# Patient Record
Sex: Female | Born: 1950 | Race: White | Hispanic: No | Marital: Single | Smoking: Former smoker
Health system: Southern US, Community
[De-identification: ages and names within clinical notes are randomized; demographics above are authoritative.]

## PROBLEM LIST (undated history)

## (undated) DIAGNOSIS — R519 Headache, unspecified: Secondary | ICD-10-CM

## (undated) DIAGNOSIS — I10 Essential (primary) hypertension: Secondary | ICD-10-CM

## (undated) DIAGNOSIS — I639 Cerebral infarction, unspecified: Secondary | ICD-10-CM

## (undated) DIAGNOSIS — R251 Tremor, unspecified: Secondary | ICD-10-CM

## (undated) DIAGNOSIS — H40059 Ocular hypertension, unspecified eye: Secondary | ICD-10-CM

## (undated) DIAGNOSIS — T7840XA Allergy, unspecified, initial encounter: Secondary | ICD-10-CM

## (undated) DIAGNOSIS — R001 Bradycardia, unspecified: Secondary | ICD-10-CM

## (undated) DIAGNOSIS — I499 Cardiac arrhythmia, unspecified: Secondary | ICD-10-CM

## (undated) DIAGNOSIS — J45909 Unspecified asthma, uncomplicated: Secondary | ICD-10-CM

## (undated) DIAGNOSIS — H269 Unspecified cataract: Secondary | ICD-10-CM

## (undated) DIAGNOSIS — R011 Cardiac murmur, unspecified: Secondary | ICD-10-CM

## (undated) DIAGNOSIS — F32A Depression, unspecified: Secondary | ICD-10-CM

## (undated) DIAGNOSIS — M1990 Unspecified osteoarthritis, unspecified site: Secondary | ICD-10-CM

## (undated) DIAGNOSIS — E785 Hyperlipidemia, unspecified: Secondary | ICD-10-CM

## (undated) DIAGNOSIS — K219 Gastro-esophageal reflux disease without esophagitis: Secondary | ICD-10-CM

## (undated) DIAGNOSIS — J189 Pneumonia, unspecified organism: Secondary | ICD-10-CM

## (undated) DIAGNOSIS — H409 Unspecified glaucoma: Secondary | ICD-10-CM

## (undated) DIAGNOSIS — F419 Anxiety disorder, unspecified: Secondary | ICD-10-CM

## (undated) DIAGNOSIS — I2510 Atherosclerotic heart disease of native coronary artery without angina pectoris: Secondary | ICD-10-CM

## (undated) HISTORY — DX: Hyperlipidemia, unspecified: E78.5

## (undated) HISTORY — PX: TONSILLECTOMY: SUR1361

## (undated) HISTORY — PX: OTHER SURGICAL HISTORY: SHX169

## (undated) HISTORY — DX: Allergy, unspecified, initial encounter: T78.40XA

## (undated) HISTORY — PX: APPENDECTOMY: SHX54

## (undated) HISTORY — PX: WRIST SURGERY: SHX841

## (undated) HISTORY — PX: FRACTURE SURGERY: SHX138

## (undated) HISTORY — PX: CATHETER REMOVAL: SHX911

## (undated) HISTORY — PX: HERNIA REPAIR: SHX51

## (undated) HISTORY — PX: COLONOSCOPY: SHX174

## (undated) HISTORY — PX: LUMBAR DISC SURGERY: SHX700

## (undated) HISTORY — DX: Unspecified glaucoma: H40.9

## (undated) HISTORY — DX: Bradycardia, unspecified: R00.1

## (undated) HISTORY — PX: TUBAL LIGATION: SHX77

## (undated) HISTORY — PX: EYE SURGERY: SHX253

## (undated) HISTORY — PX: ABDOMINAL HYSTERECTOMY: SHX81

## (undated) HISTORY — PX: SPINE SURGERY: SHX786

## (undated) HISTORY — PX: CHOLECYSTECTOMY: SHX55

## (undated) HISTORY — DX: Unspecified cataract: H26.9

## (undated) HISTORY — PX: UPPER GASTROINTESTINAL ENDOSCOPY: SHX188

---

## 1976-03-31 HISTORY — PX: WISDOM TOOTH EXTRACTION: SHX21

## 1999-04-01 HISTORY — PX: BLADDER SUSPENSION: SHX72

## 2013-09-16 DIAGNOSIS — J45909 Unspecified asthma, uncomplicated: Secondary | ICD-10-CM | POA: Insufficient documentation

## 2013-09-16 DIAGNOSIS — E7800 Pure hypercholesterolemia, unspecified: Secondary | ICD-10-CM | POA: Insufficient documentation

## 2013-09-16 DIAGNOSIS — F32A Depression, unspecified: Secondary | ICD-10-CM | POA: Insufficient documentation

## 2013-09-16 DIAGNOSIS — F419 Anxiety disorder, unspecified: Secondary | ICD-10-CM | POA: Insufficient documentation

## 2013-09-16 DIAGNOSIS — E785 Hyperlipidemia, unspecified: Secondary | ICD-10-CM | POA: Insufficient documentation

## 2013-09-16 DIAGNOSIS — I10 Essential (primary) hypertension: Secondary | ICD-10-CM | POA: Insufficient documentation

## 2014-05-12 DIAGNOSIS — R232 Flushing: Secondary | ICD-10-CM | POA: Insufficient documentation

## 2014-08-11 DIAGNOSIS — R251 Tremor, unspecified: Secondary | ICD-10-CM | POA: Insufficient documentation

## 2014-08-11 DIAGNOSIS — G250 Essential tremor: Secondary | ICD-10-CM | POA: Insufficient documentation

## 2014-12-21 DIAGNOSIS — K219 Gastro-esophageal reflux disease without esophagitis: Secondary | ICD-10-CM | POA: Insufficient documentation

## 2014-12-21 DIAGNOSIS — H409 Unspecified glaucoma: Secondary | ICD-10-CM | POA: Insufficient documentation

## 2015-01-08 DIAGNOSIS — Z23 Encounter for immunization: Secondary | ICD-10-CM | POA: Insufficient documentation

## 2015-01-08 DIAGNOSIS — M79641 Pain in right hand: Secondary | ICD-10-CM | POA: Insufficient documentation

## 2015-01-08 DIAGNOSIS — M79671 Pain in right foot: Secondary | ICD-10-CM | POA: Insufficient documentation

## 2015-03-13 DIAGNOSIS — M542 Cervicalgia: Secondary | ICD-10-CM | POA: Insufficient documentation

## 2015-03-13 DIAGNOSIS — R200 Anesthesia of skin: Secondary | ICD-10-CM | POA: Insufficient documentation

## 2015-05-02 DIAGNOSIS — S63056A Dislocation of other carpometacarpal joint of unspecified hand, initial encounter: Secondary | ICD-10-CM | POA: Insufficient documentation

## 2015-05-02 DIAGNOSIS — Z01818 Encounter for other preprocedural examination: Secondary | ICD-10-CM | POA: Insufficient documentation

## 2015-05-02 DIAGNOSIS — S63043A Subluxation of carpometacarpal joint of unspecified thumb, initial encounter: Secondary | ICD-10-CM | POA: Insufficient documentation

## 2015-06-01 DIAGNOSIS — Z0000 Encounter for general adult medical examination without abnormal findings: Secondary | ICD-10-CM | POA: Insufficient documentation

## 2015-06-15 DIAGNOSIS — M47812 Spondylosis without myelopathy or radiculopathy, cervical region: Secondary | ICD-10-CM | POA: Insufficient documentation

## 2015-06-15 DIAGNOSIS — M1990 Unspecified osteoarthritis, unspecified site: Secondary | ICD-10-CM | POA: Insufficient documentation

## 2016-02-04 DIAGNOSIS — M2061 Acquired deformities of toe(s), unspecified, right foot: Secondary | ICD-10-CM | POA: Insufficient documentation

## 2016-02-04 DIAGNOSIS — M2031 Hallux varus (acquired), right foot: Secondary | ICD-10-CM | POA: Insufficient documentation

## 2016-04-09 DIAGNOSIS — Z4789 Encounter for other orthopedic aftercare: Secondary | ICD-10-CM | POA: Insufficient documentation

## 2016-05-15 DIAGNOSIS — M7989 Other specified soft tissue disorders: Secondary | ICD-10-CM | POA: Insufficient documentation

## 2016-05-23 DIAGNOSIS — L03115 Cellulitis of right lower limb: Secondary | ICD-10-CM | POA: Insufficient documentation

## 2016-12-15 DIAGNOSIS — K219 Gastro-esophageal reflux disease without esophagitis: Secondary | ICD-10-CM | POA: Diagnosis not present

## 2016-12-15 DIAGNOSIS — M25511 Pain in right shoulder: Secondary | ICD-10-CM | POA: Diagnosis not present

## 2016-12-15 DIAGNOSIS — S4992XA Unspecified injury of left shoulder and upper arm, initial encounter: Secondary | ICD-10-CM | POA: Diagnosis not present

## 2016-12-15 DIAGNOSIS — F419 Anxiety disorder, unspecified: Secondary | ICD-10-CM | POA: Diagnosis not present

## 2016-12-15 DIAGNOSIS — M25512 Pain in left shoulder: Secondary | ICD-10-CM | POA: Diagnosis not present

## 2016-12-15 DIAGNOSIS — Z23 Encounter for immunization: Secondary | ICD-10-CM | POA: Diagnosis not present

## 2016-12-15 DIAGNOSIS — G4709 Other insomnia: Secondary | ICD-10-CM | POA: Insufficient documentation

## 2016-12-15 DIAGNOSIS — E785 Hyperlipidemia, unspecified: Secondary | ICD-10-CM | POA: Diagnosis not present

## 2016-12-15 DIAGNOSIS — I10 Essential (primary) hypertension: Secondary | ICD-10-CM | POA: Diagnosis not present

## 2017-01-12 DIAGNOSIS — E785 Hyperlipidemia, unspecified: Secondary | ICD-10-CM | POA: Diagnosis not present

## 2017-01-12 DIAGNOSIS — G4709 Other insomnia: Secondary | ICD-10-CM | POA: Diagnosis not present

## 2017-01-12 DIAGNOSIS — F419 Anxiety disorder, unspecified: Secondary | ICD-10-CM | POA: Diagnosis not present

## 2017-01-12 DIAGNOSIS — R232 Flushing: Secondary | ICD-10-CM | POA: Diagnosis not present

## 2017-02-10 DIAGNOSIS — H40003 Preglaucoma, unspecified, bilateral: Secondary | ICD-10-CM | POA: Diagnosis not present

## 2017-02-10 DIAGNOSIS — H40011 Open angle with borderline findings, low risk, right eye: Secondary | ICD-10-CM | POA: Diagnosis not present

## 2017-02-10 DIAGNOSIS — H02053 Trichiasis without entropian right eye, unspecified eyelid: Secondary | ICD-10-CM | POA: Diagnosis not present

## 2017-03-16 DIAGNOSIS — G4709 Other insomnia: Secondary | ICD-10-CM | POA: Diagnosis not present

## 2017-03-16 DIAGNOSIS — F419 Anxiety disorder, unspecified: Secondary | ICD-10-CM | POA: Diagnosis not present

## 2017-03-16 DIAGNOSIS — F329 Major depressive disorder, single episode, unspecified: Secondary | ICD-10-CM | POA: Diagnosis not present

## 2017-03-16 DIAGNOSIS — Z23 Encounter for immunization: Secondary | ICD-10-CM | POA: Diagnosis not present

## 2017-04-21 DIAGNOSIS — F419 Anxiety disorder, unspecified: Secondary | ICD-10-CM | POA: Diagnosis not present

## 2017-04-21 DIAGNOSIS — G4709 Other insomnia: Secondary | ICD-10-CM | POA: Diagnosis not present

## 2017-04-21 DIAGNOSIS — G250 Essential tremor: Secondary | ICD-10-CM | POA: Diagnosis not present

## 2017-04-21 DIAGNOSIS — J4520 Mild intermittent asthma, uncomplicated: Secondary | ICD-10-CM | POA: Diagnosis not present

## 2017-04-27 DIAGNOSIS — K432 Incisional hernia without obstruction or gangrene: Secondary | ICD-10-CM | POA: Diagnosis not present

## 2017-04-27 DIAGNOSIS — D171 Benign lipomatous neoplasm of skin and subcutaneous tissue of trunk: Secondary | ICD-10-CM | POA: Diagnosis not present

## 2017-05-04 DIAGNOSIS — K439 Ventral hernia without obstruction or gangrene: Secondary | ICD-10-CM | POA: Diagnosis not present

## 2017-05-04 DIAGNOSIS — K432 Incisional hernia without obstruction or gangrene: Secondary | ICD-10-CM | POA: Diagnosis not present

## 2017-05-04 DIAGNOSIS — I700 Atherosclerosis of aorta: Secondary | ICD-10-CM | POA: Diagnosis not present

## 2017-05-07 DIAGNOSIS — K432 Incisional hernia without obstruction or gangrene: Secondary | ICD-10-CM | POA: Diagnosis not present

## 2017-05-19 DIAGNOSIS — K432 Incisional hernia without obstruction or gangrene: Secondary | ICD-10-CM | POA: Diagnosis not present

## 2017-05-19 DIAGNOSIS — K430 Incisional hernia with obstruction, without gangrene: Secondary | ICD-10-CM | POA: Diagnosis not present

## 2017-05-23 DIAGNOSIS — S93402A Sprain of unspecified ligament of left ankle, initial encounter: Secondary | ICD-10-CM | POA: Diagnosis not present

## 2017-05-26 DIAGNOSIS — S86012A Strain of left Achilles tendon, initial encounter: Secondary | ICD-10-CM | POA: Diagnosis not present

## 2017-05-26 DIAGNOSIS — S93402A Sprain of unspecified ligament of left ankle, initial encounter: Secondary | ICD-10-CM | POA: Diagnosis not present

## 2017-05-26 DIAGNOSIS — M7732 Calcaneal spur, left foot: Secondary | ICD-10-CM | POA: Diagnosis not present

## 2017-06-03 DIAGNOSIS — H40011 Open angle with borderline findings, low risk, right eye: Secondary | ICD-10-CM | POA: Diagnosis not present

## 2017-06-03 DIAGNOSIS — H40003 Preglaucoma, unspecified, bilateral: Secondary | ICD-10-CM | POA: Diagnosis not present

## 2017-06-03 DIAGNOSIS — H02056 Trichiasis without entropian left eye, unspecified eyelid: Secondary | ICD-10-CM | POA: Diagnosis not present

## 2017-06-03 DIAGNOSIS — H02053 Trichiasis without entropian right eye, unspecified eyelid: Secondary | ICD-10-CM | POA: Diagnosis not present

## 2017-06-24 DIAGNOSIS — M19072 Primary osteoarthritis, left ankle and foot: Secondary | ICD-10-CM | POA: Diagnosis not present

## 2017-06-24 DIAGNOSIS — S93602D Unspecified sprain of left foot, subsequent encounter: Secondary | ICD-10-CM | POA: Diagnosis not present

## 2017-06-24 DIAGNOSIS — S93402A Sprain of unspecified ligament of left ankle, initial encounter: Secondary | ICD-10-CM | POA: Diagnosis not present

## 2017-07-21 DIAGNOSIS — Z0000 Encounter for general adult medical examination without abnormal findings: Secondary | ICD-10-CM | POA: Diagnosis not present

## 2017-07-21 DIAGNOSIS — G4709 Other insomnia: Secondary | ICD-10-CM | POA: Diagnosis not present

## 2017-07-21 DIAGNOSIS — I10 Essential (primary) hypertension: Secondary | ICD-10-CM | POA: Diagnosis not present

## 2017-07-21 DIAGNOSIS — F329 Major depressive disorder, single episode, unspecified: Secondary | ICD-10-CM | POA: Diagnosis not present

## 2017-07-21 DIAGNOSIS — Z87891 Personal history of nicotine dependence: Secondary | ICD-10-CM | POA: Diagnosis not present

## 2017-07-21 DIAGNOSIS — K760 Fatty (change of) liver, not elsewhere classified: Secondary | ICD-10-CM | POA: Insufficient documentation

## 2017-07-21 DIAGNOSIS — E785 Hyperlipidemia, unspecified: Secondary | ICD-10-CM | POA: Diagnosis not present

## 2017-08-31 DIAGNOSIS — S6391XA Sprain of unspecified part of right wrist and hand, initial encounter: Secondary | ICD-10-CM | POA: Diagnosis not present

## 2017-10-07 DIAGNOSIS — H40003 Preglaucoma, unspecified, bilateral: Secondary | ICD-10-CM | POA: Diagnosis not present

## 2017-10-07 DIAGNOSIS — H40011 Open angle with borderline findings, low risk, right eye: Secondary | ICD-10-CM | POA: Diagnosis not present

## 2017-10-07 DIAGNOSIS — H02056 Trichiasis without entropian left eye, unspecified eyelid: Secondary | ICD-10-CM | POA: Diagnosis not present

## 2017-10-07 DIAGNOSIS — H02053 Trichiasis without entropian right eye, unspecified eyelid: Secondary | ICD-10-CM | POA: Diagnosis not present

## 2018-02-01 DIAGNOSIS — F419 Anxiety disorder, unspecified: Secondary | ICD-10-CM | POA: Diagnosis not present

## 2018-02-01 DIAGNOSIS — G250 Essential tremor: Secondary | ICD-10-CM | POA: Diagnosis not present

## 2018-02-01 DIAGNOSIS — R413 Other amnesia: Secondary | ICD-10-CM | POA: Diagnosis not present

## 2018-02-01 DIAGNOSIS — I10 Essential (primary) hypertension: Secondary | ICD-10-CM | POA: Diagnosis not present

## 2018-02-01 DIAGNOSIS — E785 Hyperlipidemia, unspecified: Secondary | ICD-10-CM | POA: Diagnosis not present

## 2018-02-01 DIAGNOSIS — G4709 Other insomnia: Secondary | ICD-10-CM | POA: Diagnosis not present

## 2018-02-01 DIAGNOSIS — L821 Other seborrheic keratosis: Secondary | ICD-10-CM | POA: Diagnosis not present

## 2018-02-01 DIAGNOSIS — K760 Fatty (change of) liver, not elsewhere classified: Secondary | ICD-10-CM | POA: Diagnosis not present

## 2018-02-17 DIAGNOSIS — H02056 Trichiasis without entropian left eye, unspecified eyelid: Secondary | ICD-10-CM | POA: Diagnosis not present

## 2018-02-17 DIAGNOSIS — H40003 Preglaucoma, unspecified, bilateral: Secondary | ICD-10-CM | POA: Diagnosis not present

## 2018-02-17 DIAGNOSIS — H02053 Trichiasis without entropian right eye, unspecified eyelid: Secondary | ICD-10-CM | POA: Diagnosis not present

## 2018-02-17 DIAGNOSIS — H40011 Open angle with borderline findings, low risk, right eye: Secondary | ICD-10-CM | POA: Diagnosis not present

## 2018-05-04 DIAGNOSIS — R4189 Other symptoms and signs involving cognitive functions and awareness: Secondary | ICD-10-CM | POA: Diagnosis not present

## 2018-05-04 DIAGNOSIS — F341 Dysthymic disorder: Secondary | ICD-10-CM | POA: Diagnosis not present

## 2018-05-04 DIAGNOSIS — F413 Other mixed anxiety disorders: Secondary | ICD-10-CM | POA: Diagnosis not present

## 2018-06-15 DIAGNOSIS — G3184 Mild cognitive impairment, so stated: Secondary | ICD-10-CM | POA: Diagnosis not present

## 2018-06-15 DIAGNOSIS — F413 Other mixed anxiety disorders: Secondary | ICD-10-CM | POA: Diagnosis not present

## 2018-06-15 DIAGNOSIS — F341 Dysthymic disorder: Secondary | ICD-10-CM | POA: Diagnosis not present

## 2018-07-23 DIAGNOSIS — I10 Essential (primary) hypertension: Secondary | ICD-10-CM | POA: Diagnosis not present

## 2018-07-23 DIAGNOSIS — R252 Cramp and spasm: Secondary | ICD-10-CM | POA: Diagnosis not present

## 2018-07-23 DIAGNOSIS — Z0000 Encounter for general adult medical examination without abnormal findings: Secondary | ICD-10-CM | POA: Diagnosis not present

## 2018-07-23 DIAGNOSIS — F329 Major depressive disorder, single episode, unspecified: Secondary | ICD-10-CM | POA: Diagnosis not present

## 2018-07-23 DIAGNOSIS — Z87891 Personal history of nicotine dependence: Secondary | ICD-10-CM | POA: Diagnosis not present

## 2018-07-23 DIAGNOSIS — E7800 Pure hypercholesterolemia, unspecified: Secondary | ICD-10-CM | POA: Diagnosis not present

## 2018-07-23 DIAGNOSIS — Z79899 Other long term (current) drug therapy: Secondary | ICD-10-CM | POA: Diagnosis not present

## 2018-08-12 DIAGNOSIS — Z780 Asymptomatic menopausal state: Secondary | ICD-10-CM | POA: Diagnosis not present

## 2018-08-12 DIAGNOSIS — M8588 Other specified disorders of bone density and structure, other site: Secondary | ICD-10-CM | POA: Diagnosis not present

## 2018-08-12 DIAGNOSIS — M8580 Other specified disorders of bone density and structure, unspecified site: Secondary | ICD-10-CM | POA: Diagnosis not present

## 2018-08-12 DIAGNOSIS — Z13820 Encounter for screening for osteoporosis: Secondary | ICD-10-CM | POA: Diagnosis not present

## 2018-08-12 DIAGNOSIS — Z1231 Encounter for screening mammogram for malignant neoplasm of breast: Secondary | ICD-10-CM | POA: Diagnosis not present

## 2018-08-13 DIAGNOSIS — R252 Cramp and spasm: Secondary | ICD-10-CM | POA: Diagnosis not present

## 2018-08-13 DIAGNOSIS — R413 Other amnesia: Secondary | ICD-10-CM | POA: Diagnosis not present

## 2018-08-13 DIAGNOSIS — R251 Tremor, unspecified: Secondary | ICD-10-CM | POA: Diagnosis not present

## 2018-08-25 DIAGNOSIS — R251 Tremor, unspecified: Secondary | ICD-10-CM | POA: Diagnosis not present

## 2018-08-25 DIAGNOSIS — R413 Other amnesia: Secondary | ICD-10-CM | POA: Diagnosis not present

## 2018-09-10 DIAGNOSIS — I6381 Other cerebral infarction due to occlusion or stenosis of small artery: Secondary | ICD-10-CM | POA: Diagnosis not present

## 2018-09-10 DIAGNOSIS — R413 Other amnesia: Secondary | ICD-10-CM | POA: Insufficient documentation

## 2018-09-10 DIAGNOSIS — R251 Tremor, unspecified: Secondary | ICD-10-CM | POA: Diagnosis not present

## 2018-09-10 DIAGNOSIS — R252 Cramp and spasm: Secondary | ICD-10-CM | POA: Diagnosis not present

## 2019-01-07 DIAGNOSIS — G250 Essential tremor: Secondary | ICD-10-CM | POA: Diagnosis not present

## 2019-01-07 DIAGNOSIS — Z79899 Other long term (current) drug therapy: Secondary | ICD-10-CM | POA: Diagnosis not present

## 2019-01-07 DIAGNOSIS — I10 Essential (primary) hypertension: Secondary | ICD-10-CM | POA: Diagnosis not present

## 2019-01-07 DIAGNOSIS — E7800 Pure hypercholesterolemia, unspecified: Secondary | ICD-10-CM | POA: Diagnosis not present

## 2019-01-07 DIAGNOSIS — R7309 Other abnormal glucose: Secondary | ICD-10-CM | POA: Insufficient documentation

## 2019-01-07 DIAGNOSIS — Z87891 Personal history of nicotine dependence: Secondary | ICD-10-CM | POA: Diagnosis not present

## 2019-01-07 DIAGNOSIS — F419 Anxiety disorder, unspecified: Secondary | ICD-10-CM | POA: Diagnosis not present

## 2019-01-07 DIAGNOSIS — K219 Gastro-esophageal reflux disease without esophagitis: Secondary | ICD-10-CM | POA: Diagnosis not present

## 2019-01-07 DIAGNOSIS — Z23 Encounter for immunization: Secondary | ICD-10-CM | POA: Diagnosis not present

## 2019-01-07 DIAGNOSIS — F329 Major depressive disorder, single episode, unspecified: Secondary | ICD-10-CM | POA: Diagnosis not present

## 2019-01-13 DIAGNOSIS — I6381 Other cerebral infarction due to occlusion or stenosis of small artery: Secondary | ICD-10-CM | POA: Diagnosis not present

## 2019-01-13 DIAGNOSIS — R251 Tremor, unspecified: Secondary | ICD-10-CM | POA: Diagnosis not present

## 2019-01-13 DIAGNOSIS — R413 Other amnesia: Secondary | ICD-10-CM | POA: Diagnosis not present

## 2019-01-21 DIAGNOSIS — Z8371 Family history of colonic polyps: Secondary | ICD-10-CM | POA: Diagnosis not present

## 2019-01-21 DIAGNOSIS — K219 Gastro-esophageal reflux disease without esophagitis: Secondary | ICD-10-CM | POA: Diagnosis not present

## 2019-02-11 DIAGNOSIS — D128 Benign neoplasm of rectum: Secondary | ICD-10-CM | POA: Diagnosis not present

## 2019-02-11 DIAGNOSIS — K648 Other hemorrhoids: Secondary | ICD-10-CM | POA: Diagnosis not present

## 2019-02-11 DIAGNOSIS — D123 Benign neoplasm of transverse colon: Secondary | ICD-10-CM | POA: Diagnosis not present

## 2019-02-11 DIAGNOSIS — K219 Gastro-esophageal reflux disease without esophagitis: Secondary | ICD-10-CM | POA: Diagnosis not present

## 2019-02-11 DIAGNOSIS — K317 Polyp of stomach and duodenum: Secondary | ICD-10-CM | POA: Diagnosis not present

## 2019-02-11 DIAGNOSIS — D126 Benign neoplasm of colon, unspecified: Secondary | ICD-10-CM | POA: Diagnosis not present

## 2019-02-11 DIAGNOSIS — K635 Polyp of colon: Secondary | ICD-10-CM | POA: Diagnosis not present

## 2019-02-11 DIAGNOSIS — Z1211 Encounter for screening for malignant neoplasm of colon: Secondary | ICD-10-CM | POA: Diagnosis not present

## 2019-02-11 DIAGNOSIS — Z8371 Family history of colonic polyps: Secondary | ICD-10-CM | POA: Diagnosis not present

## 2019-02-11 DIAGNOSIS — Z86010 Personal history of colonic polyps: Secondary | ICD-10-CM | POA: Diagnosis not present

## 2019-02-11 DIAGNOSIS — K449 Diaphragmatic hernia without obstruction or gangrene: Secondary | ICD-10-CM | POA: Diagnosis not present

## 2019-02-11 DIAGNOSIS — K2960 Other gastritis without bleeding: Secondary | ICD-10-CM | POA: Diagnosis not present

## 2019-02-11 DIAGNOSIS — K3189 Other diseases of stomach and duodenum: Secondary | ICD-10-CM | POA: Diagnosis not present

## 2019-06-14 DIAGNOSIS — R252 Cramp and spasm: Secondary | ICD-10-CM | POA: Diagnosis not present

## 2019-06-14 DIAGNOSIS — R413 Other amnesia: Secondary | ICD-10-CM | POA: Diagnosis not present

## 2019-06-14 DIAGNOSIS — R42 Dizziness and giddiness: Secondary | ICD-10-CM | POA: Diagnosis not present

## 2019-06-14 DIAGNOSIS — Z8673 Personal history of transient ischemic attack (TIA), and cerebral infarction without residual deficits: Secondary | ICD-10-CM | POA: Diagnosis not present

## 2019-06-14 DIAGNOSIS — R519 Headache, unspecified: Secondary | ICD-10-CM | POA: Diagnosis not present

## 2019-06-14 DIAGNOSIS — R251 Tremor, unspecified: Secondary | ICD-10-CM | POA: Diagnosis not present

## 2019-08-12 DIAGNOSIS — H40011 Open angle with borderline findings, low risk, right eye: Secondary | ICD-10-CM | POA: Diagnosis not present

## 2019-08-12 DIAGNOSIS — H40003 Preglaucoma, unspecified, bilateral: Secondary | ICD-10-CM | POA: Diagnosis not present

## 2019-08-12 DIAGNOSIS — H02056 Trichiasis without entropian left eye, unspecified eyelid: Secondary | ICD-10-CM | POA: Diagnosis not present

## 2019-08-12 DIAGNOSIS — H02053 Trichiasis without entropian right eye, unspecified eyelid: Secondary | ICD-10-CM | POA: Diagnosis not present

## 2019-09-19 DIAGNOSIS — S61215A Laceration without foreign body of left ring finger without damage to nail, initial encounter: Secondary | ICD-10-CM | POA: Diagnosis not present

## 2019-09-26 DIAGNOSIS — H9313 Tinnitus, bilateral: Secondary | ICD-10-CM | POA: Insufficient documentation

## 2019-09-26 DIAGNOSIS — Z8616 Personal history of COVID-19: Secondary | ICD-10-CM | POA: Diagnosis not present

## 2019-09-26 DIAGNOSIS — H903 Sensorineural hearing loss, bilateral: Secondary | ICD-10-CM | POA: Diagnosis not present

## 2019-10-14 DIAGNOSIS — I10 Essential (primary) hypertension: Secondary | ICD-10-CM | POA: Diagnosis not present

## 2019-10-14 DIAGNOSIS — F419 Anxiety disorder, unspecified: Secondary | ICD-10-CM | POA: Diagnosis not present

## 2019-10-14 DIAGNOSIS — H6991 Unspecified Eustachian tube disorder, right ear: Secondary | ICD-10-CM | POA: Diagnosis not present

## 2019-10-14 DIAGNOSIS — J4520 Mild intermittent asthma, uncomplicated: Secondary | ICD-10-CM | POA: Diagnosis not present

## 2019-10-14 DIAGNOSIS — G250 Essential tremor: Secondary | ICD-10-CM | POA: Diagnosis not present

## 2019-10-14 DIAGNOSIS — R7309 Other abnormal glucose: Secondary | ICD-10-CM | POA: Diagnosis not present

## 2019-10-14 DIAGNOSIS — K219 Gastro-esophageal reflux disease without esophagitis: Secondary | ICD-10-CM | POA: Diagnosis not present

## 2019-10-14 DIAGNOSIS — Z87891 Personal history of nicotine dependence: Secondary | ICD-10-CM | POA: Diagnosis not present

## 2019-10-14 DIAGNOSIS — E7800 Pure hypercholesterolemia, unspecified: Secondary | ICD-10-CM | POA: Diagnosis not present

## 2019-10-14 DIAGNOSIS — G4709 Other insomnia: Secondary | ICD-10-CM | POA: Diagnosis not present

## 2019-10-14 DIAGNOSIS — F329 Major depressive disorder, single episode, unspecified: Secondary | ICD-10-CM | POA: Diagnosis not present

## 2019-10-17 DIAGNOSIS — R519 Headache, unspecified: Secondary | ICD-10-CM | POA: Diagnosis not present

## 2019-10-17 DIAGNOSIS — R208 Other disturbances of skin sensation: Secondary | ICD-10-CM | POA: Diagnosis not present

## 2019-10-17 DIAGNOSIS — R252 Cramp and spasm: Secondary | ICD-10-CM | POA: Diagnosis not present

## 2019-12-02 DIAGNOSIS — H02053 Trichiasis without entropian right eye, unspecified eyelid: Secondary | ICD-10-CM | POA: Diagnosis not present

## 2019-12-02 DIAGNOSIS — H40011 Open angle with borderline findings, low risk, right eye: Secondary | ICD-10-CM | POA: Diagnosis not present

## 2019-12-02 DIAGNOSIS — H40003 Preglaucoma, unspecified, bilateral: Secondary | ICD-10-CM | POA: Diagnosis not present

## 2019-12-02 DIAGNOSIS — H02056 Trichiasis without entropian left eye, unspecified eyelid: Secondary | ICD-10-CM | POA: Diagnosis not present

## 2019-12-13 DIAGNOSIS — R202 Paresthesia of skin: Secondary | ICD-10-CM | POA: Diagnosis not present

## 2019-12-13 DIAGNOSIS — R519 Headache, unspecified: Secondary | ICD-10-CM | POA: Diagnosis not present

## 2019-12-13 DIAGNOSIS — R252 Cramp and spasm: Secondary | ICD-10-CM | POA: Diagnosis not present

## 2020-02-01 DIAGNOSIS — H903 Sensorineural hearing loss, bilateral: Secondary | ICD-10-CM | POA: Diagnosis not present

## 2020-02-24 DIAGNOSIS — Z1231 Encounter for screening mammogram for malignant neoplasm of breast: Secondary | ICD-10-CM | POA: Diagnosis not present

## 2020-03-06 DIAGNOSIS — H73891 Other specified disorders of tympanic membrane, right ear: Secondary | ICD-10-CM | POA: Diagnosis not present

## 2020-03-06 DIAGNOSIS — Z974 Presence of external hearing-aid: Secondary | ICD-10-CM | POA: Diagnosis not present

## 2020-03-06 DIAGNOSIS — H9221 Otorrhagia, right ear: Secondary | ICD-10-CM | POA: Diagnosis not present

## 2020-03-06 DIAGNOSIS — H6121 Impacted cerumen, right ear: Secondary | ICD-10-CM | POA: Diagnosis not present

## 2020-04-04 DIAGNOSIS — H40011 Open angle with borderline findings, low risk, right eye: Secondary | ICD-10-CM | POA: Diagnosis not present

## 2020-04-04 DIAGNOSIS — H02053 Trichiasis without entropian right eye, unspecified eyelid: Secondary | ICD-10-CM | POA: Diagnosis not present

## 2020-04-04 DIAGNOSIS — H02056 Trichiasis without entropian left eye, unspecified eyelid: Secondary | ICD-10-CM | POA: Diagnosis not present

## 2020-04-23 DIAGNOSIS — R519 Headache, unspecified: Secondary | ICD-10-CM | POA: Diagnosis not present

## 2020-04-23 DIAGNOSIS — R252 Cramp and spasm: Secondary | ICD-10-CM | POA: Diagnosis not present

## 2020-04-25 DIAGNOSIS — Z79899 Other long term (current) drug therapy: Secondary | ICD-10-CM | POA: Diagnosis not present

## 2020-04-25 DIAGNOSIS — K5900 Constipation, unspecified: Secondary | ICD-10-CM | POA: Diagnosis not present

## 2020-04-25 DIAGNOSIS — Z0001 Encounter for general adult medical examination with abnormal findings: Secondary | ICD-10-CM | POA: Diagnosis not present

## 2020-04-25 DIAGNOSIS — Z87891 Personal history of nicotine dependence: Secondary | ICD-10-CM | POA: Diagnosis not present

## 2020-04-25 DIAGNOSIS — K219 Gastro-esophageal reflux disease without esophagitis: Secondary | ICD-10-CM | POA: Diagnosis not present

## 2020-04-25 DIAGNOSIS — E7800 Pure hypercholesterolemia, unspecified: Secondary | ICD-10-CM | POA: Diagnosis not present

## 2020-04-25 DIAGNOSIS — E785 Hyperlipidemia, unspecified: Secondary | ICD-10-CM | POA: Diagnosis not present

## 2020-04-25 DIAGNOSIS — I10 Essential (primary) hypertension: Secondary | ICD-10-CM | POA: Diagnosis not present

## 2020-04-25 DIAGNOSIS — Z0000 Encounter for general adult medical examination without abnormal findings: Secondary | ICD-10-CM | POA: Diagnosis not present

## 2020-04-25 DIAGNOSIS — Z974 Presence of external hearing-aid: Secondary | ICD-10-CM | POA: Diagnosis not present

## 2020-04-25 DIAGNOSIS — H9190 Unspecified hearing loss, unspecified ear: Secondary | ICD-10-CM | POA: Diagnosis not present

## 2020-04-25 DIAGNOSIS — F419 Anxiety disorder, unspecified: Secondary | ICD-10-CM | POA: Diagnosis not present

## 2020-05-22 DIAGNOSIS — B372 Candidiasis of skin and nail: Secondary | ICD-10-CM | POA: Diagnosis not present

## 2020-05-22 DIAGNOSIS — L219 Seborrheic dermatitis, unspecified: Secondary | ICD-10-CM | POA: Diagnosis not present

## 2020-08-15 DIAGNOSIS — H40011 Open angle with borderline findings, low risk, right eye: Secondary | ICD-10-CM | POA: Diagnosis not present

## 2020-08-15 DIAGNOSIS — H02056 Trichiasis without entropian left eye, unspecified eyelid: Secondary | ICD-10-CM | POA: Diagnosis not present

## 2020-08-15 DIAGNOSIS — H02053 Trichiasis without entropian right eye, unspecified eyelid: Secondary | ICD-10-CM | POA: Diagnosis not present

## 2020-08-15 DIAGNOSIS — H40003 Preglaucoma, unspecified, bilateral: Secondary | ICD-10-CM | POA: Diagnosis not present

## 2020-09-03 DIAGNOSIS — R252 Cramp and spasm: Secondary | ICD-10-CM | POA: Diagnosis not present

## 2020-09-03 DIAGNOSIS — R519 Headache, unspecified: Secondary | ICD-10-CM | POA: Diagnosis not present

## 2020-09-03 DIAGNOSIS — M542 Cervicalgia: Secondary | ICD-10-CM | POA: Diagnosis not present

## 2020-10-15 DIAGNOSIS — E7800 Pure hypercholesterolemia, unspecified: Secondary | ICD-10-CM | POA: Diagnosis not present

## 2020-10-15 DIAGNOSIS — F4322 Adjustment disorder with anxiety: Secondary | ICD-10-CM | POA: Diagnosis not present

## 2020-10-15 DIAGNOSIS — I10 Essential (primary) hypertension: Secondary | ICD-10-CM | POA: Diagnosis not present

## 2020-10-15 DIAGNOSIS — J4520 Mild intermittent asthma, uncomplicated: Secondary | ICD-10-CM | POA: Diagnosis not present

## 2020-10-15 DIAGNOSIS — G4709 Other insomnia: Secondary | ICD-10-CM | POA: Diagnosis not present

## 2020-10-15 DIAGNOSIS — G250 Essential tremor: Secondary | ICD-10-CM | POA: Diagnosis not present

## 2020-11-05 ENCOUNTER — Encounter (HOSPITAL_BASED_OUTPATIENT_CLINIC_OR_DEPARTMENT_OTHER): Payer: Self-pay

## 2020-11-05 ENCOUNTER — Emergency Department (HOSPITAL_BASED_OUTPATIENT_CLINIC_OR_DEPARTMENT_OTHER): Payer: PPO

## 2020-11-05 ENCOUNTER — Emergency Department (HOSPITAL_BASED_OUTPATIENT_CLINIC_OR_DEPARTMENT_OTHER)
Admission: EM | Admit: 2020-11-05 | Discharge: 2020-11-05 | Disposition: A | Discharge status: Home / Self Care | Payer: PPO | Attending: Emergency Medicine | Admitting: Emergency Medicine

## 2020-11-05 DIAGNOSIS — J45909 Unspecified asthma, uncomplicated: Secondary | ICD-10-CM | POA: Insufficient documentation

## 2020-11-05 DIAGNOSIS — R079 Chest pain, unspecified: Secondary | ICD-10-CM

## 2020-11-05 DIAGNOSIS — Z20822 Contact with and (suspected) exposure to covid-19: Secondary | ICD-10-CM | POA: Diagnosis not present

## 2020-11-05 DIAGNOSIS — Z20828 Contact with and (suspected) exposure to other viral communicable diseases: Secondary | ICD-10-CM | POA: Diagnosis not present

## 2020-11-05 DIAGNOSIS — Z87891 Personal history of nicotine dependence: Secondary | ICD-10-CM | POA: Diagnosis not present

## 2020-11-05 DIAGNOSIS — R0789 Other chest pain: Secondary | ICD-10-CM | POA: Diagnosis not present

## 2020-11-05 DIAGNOSIS — I10 Essential (primary) hypertension: Secondary | ICD-10-CM | POA: Insufficient documentation

## 2020-11-05 DIAGNOSIS — R519 Headache, unspecified: Secondary | ICD-10-CM | POA: Diagnosis not present

## 2020-11-05 DIAGNOSIS — R6884 Jaw pain: Secondary | ICD-10-CM | POA: Diagnosis not present

## 2020-11-05 DIAGNOSIS — U071 COVID-19: Secondary | ICD-10-CM | POA: Diagnosis not present

## 2020-11-05 HISTORY — DX: Cerebral infarction, unspecified: I63.9

## 2020-11-05 HISTORY — DX: Unspecified asthma, uncomplicated: J45.909

## 2020-11-05 HISTORY — DX: Ocular hypertension, unspecified eye: H40.059

## 2020-11-05 HISTORY — DX: Tremor, unspecified: R25.1

## 2020-11-05 HISTORY — DX: Anxiety disorder, unspecified: F41.9

## 2020-11-05 HISTORY — DX: Essential (primary) hypertension: I10

## 2020-11-05 HISTORY — DX: Depression, unspecified: F32.A

## 2020-11-05 LAB — CBC WITH DIFFERENTIAL/PLATELET
Abs Immature Granulocytes: 0.04 10*3/uL (ref 0.00–0.07)
Basophils Absolute: 0.1 10*3/uL (ref 0.0–0.1)
Basophils Relative: 1 %
Eosinophils Absolute: 0.1 10*3/uL (ref 0.0–0.5)
Eosinophils Relative: 1 %
HCT: 41.3 % (ref 36.0–46.0)
Hemoglobin: 13.3 g/dL (ref 12.0–15.0)
Immature Granulocytes: 1 %
Lymphocytes Relative: 18 %
Lymphs Abs: 1.3 10*3/uL (ref 0.7–4.0)
MCH: 28.5 pg (ref 26.0–34.0)
MCHC: 32.2 g/dL (ref 30.0–36.0)
MCV: 88.6 fL (ref 80.0–100.0)
Monocytes Absolute: 0.4 10*3/uL (ref 0.1–1.0)
Monocytes Relative: 6 %
Neutro Abs: 5.1 10*3/uL (ref 1.7–7.7)
Neutrophils Relative %: 73 %
Platelets: 240 10*3/uL (ref 150–400)
RBC: 4.66 MIL/uL (ref 3.87–5.11)
RDW: 13.2 % (ref 11.5–15.5)
WBC: 7 10*3/uL (ref 4.0–10.5)
nRBC: 0 % (ref 0.0–0.2)

## 2020-11-05 LAB — BASIC METABOLIC PANEL WITH GFR
Anion gap: 9 (ref 5–15)
BUN: 11 mg/dL (ref 8–23)
CO2: 29 mmol/L (ref 22–32)
Calcium: 8.9 mg/dL (ref 8.9–10.3)
Chloride: 101 mmol/L (ref 98–111)
Creatinine, Ser: 0.71 mg/dL (ref 0.44–1.00)
GFR, Estimated: >60 mL/min
Glucose, Bld: 104 mg/dL — ABNORMAL HIGH (ref 70–99)
Potassium: 4.3 mmol/L (ref 3.5–5.1)
Sodium: 139 mmol/L (ref 135–145)

## 2020-11-05 LAB — TROPONIN I (HIGH SENSITIVITY)
Troponin I (High Sensitivity): 2 ng/L
Troponin I (High Sensitivity): 3 ng/L

## 2020-11-05 MED ORDER — DIPHENHYDRAMINE HCL 50 MG/ML IJ SOLN
25.0000 mg | Freq: Once | INTRAMUSCULAR | Status: AC
  Administered 2020-11-05: 25 mg via INTRAVENOUS
  Filled 2020-11-05: qty 1

## 2020-11-05 MED ORDER — SODIUM CHLORIDE 0.9 % IV BOLUS
500.0000 mL | Freq: Once | INTRAVENOUS | Status: AC
  Administered 2020-11-05: 500 mL via INTRAVENOUS

## 2020-11-05 MED ORDER — METOCLOPRAMIDE HCL 5 MG/ML IJ SOLN
10.0000 mg | Freq: Once | INTRAMUSCULAR | Status: AC
  Administered 2020-11-05: 10 mg via INTRAVENOUS
  Filled 2020-11-05: qty 2

## 2020-11-05 NOTE — ED Provider Notes (Signed)
[facility] Provider Note   CSN: [id number] Arrival date & time: 11/05/20  1209     History Chief Complaint  Patient presents with   Chest Pain    Christy Jenkins is a 70 y.o. female.  Christy Jenkins is a 69 y.o. female with hx of HTN, stroke, tremors, anxiety and depression, who presents for evaluation of chest pain, jaw pain and headache. Pt reports he has been having jaw pain for about a week, it is present across her lower jaw, she went to her dentist last week for evaluation of this pain and they did not see any issues with her teeth.  She reports then Saturday she started having onset of chest pain, headache and neck pain.  Jaw pain has also continued.  She reports since onset of chest pain and headache they have waxed and waned in intensity but never completely resolved.  Patient reports headache was sudden in onset but took 30 minutes to an hour to reach maximal intensity.  She reports she has a history of headaches but this feels worse and different than usual.  She denies any associated visual changes, dizziness, facial droop, numbness, weakness or tingling.  Reports she has had some pain at the base of her neck but no neck stiffness.  She describes chest pain as a substernal discomfort, pain is nonradiating.  Pain is not pleuritic.  Patient denies any aggravating or alleviating factors for pain, not specifically worse with exertion.  Patient was at rest when symptoms initially began.  No associated shortness of breath or syncope.  No prior history of cardiac events, does not have a cardiologist she follows with.  No other aggravating or alleviating factors.  The history is provided by the patient.      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   Past Surgical History:  Procedure Laterality Date   ABDOMINAL HYSTERECTOMY     CHOLECYSTECTOMY     HERNIA REPAIR     TONSILLECTOMY       OB History   No obstetric history on file.     No family history on file.  Social History   Tobacco Use   Smoking status: Former    Types: Cigarettes  Substance Use Topics   Alcohol use: Not Currently    Home Medications Prior to Admission medications   Not on File    Allergies    Statins and Tape  Review of Systems   Review of Systems  Constitutional:  Negative for chills and fever.  HENT: Negative.    Respiratory:  Negative for cough and shortness of breath.   Cardiovascular:  Positive for chest pain.  Gastrointestinal:  Negative for abdominal pain, nausea and vomiting.  Genitourinary:  Negative for dysuria.  Musculoskeletal:  Positive for neck pain. Negative for arthralgias, myalgias and neck stiffness.  Skin:  Negative for color change and rash.  Neurological:  Positive for headaches. Negative for dizziness, syncope, facial asymmetry, speech difficulty, weakness, light-headedness and numbness.  All other systems reviewed and are negative.  Physical Exam Updated Vital Signs BP 135/64   Pulse 64   Temp (!) 97.5 F (36.4 C) (Oral)   Resp 19  Ht '5\' 3"'$  (1.6 m)   Wt 90.7 kg   SpO2 96%   BMI 35.43 kg/m   Physical Exam Vitals and nursing note reviewed.  Constitutional:      General: She is not in acute distress.    Appearance: Normal appearance. She is well-developed. She is not ill-appearing or diaphoretic.  HENT:     Head: Normocephalic and atraumatic.  Eyes:     General:        Right eye: No discharge.        Left eye: No discharge.     Pupils: Pupils are equal, round, and reactive to light.  Neck:     Comments: No rigidity or nuchal signs Cardiovascular:     Rate and Rhythm: Normal rate and regular rhythm.     Pulses: Normal pulses.          Radial pulses are 2+ on the right side and 2+ on the  left side.       Dorsalis pedis pulses are 2+ on the right side and 2+ on the left side.     Heart sounds: Normal heart sounds. No murmur heard.   No friction rub. No gallop.  Pulmonary:     Effort: Pulmonary effort is normal. No respiratory distress.     Breath sounds: Normal breath sounds. No wheezing or rales.     Comments: Respirations equal and unlabored, patient able to speak in full sentences, lungs clear to auscultation bilaterally  Chest:     Chest wall: No tenderness.  Abdominal:     General: Bowel sounds are normal. There is no distension.     Palpations: Abdomen is soft. There is no mass.     Tenderness: There is no abdominal tenderness. There is no guarding.     Comments: Abdomen soft, nondistended, nontender to palpation in all quadrants without guarding or peritoneal signs  Musculoskeletal:        General: No deformity.     Cervical back: Neck supple.     Right lower leg: No tenderness. No edema.     Left lower leg: No tenderness. No edema.  Skin:    General: Skin is warm and dry.     Capillary Refill: Capillary refill takes less than 2 seconds.  Neurological:     Mental Status: She is alert and oriented to person, place, and time.     Coordination: Coordination normal.     Comments: Speech is clear, able to follow commands CN III-XII intact Normal strength in upper and lower extremities bilaterally including dorsiflexion and plantar flexion, strong and equal grip strength Sensation normal to light and sharp touch Moves extremities without ataxia, coordination intact  Psychiatric:        Mood and Affect: Mood normal.        Behavior: Behavior normal.    ED Results / Procedures / Treatments   Labs (all labs ordered are listed, but only abnormal results are displayed) Labs Reviewed  BASIC METABOLIC PANEL - Abnormal; Notable for the following components:      Result Value   Glucose, Bld 104 (*)    All other components within normal limits  CBC WITH  DIFFERENTIAL/PLATELET  TROPONIN I (HIGH SENSITIVITY)  TROPONIN I (HIGH SENSITIVITY)    EKG EKG Interpretation  Date/Time:  Monday November 05 2020 12:17:50 EDT Ventricular Rate:  65 PR Interval:  166 QRS Duration: 95 QT Interval:  422 QTC Calculation: 439 R Axis:   35 Text Interpretation: Sinus rhythm Normal  ECG Confirmed by Blanchie Dessert [id number] on 11/05/2020 12:20:56 PM  Radiology DG Chest 2 View  Result Date: 11/05/2020 CLINICAL DATA:  Chest pain. EXAM: CHEST - 2 VIEW COMPARISON:  None. FINDINGS: Both lungs are clear. Heart and mediastinum are within normal limits. Negative for a pneumothorax. Atherosclerotic calcifications at the aortic arch. Degenerative changes in the upper lumbar spine region. IMPRESSION: No acute cardiopulmonary disease. Electronically Signed   By: Markus Daft M.D.   On: 11/05/2020 13:59   CT Head Wo Contrast  Result Date: 11/05/2020 CLINICAL DATA:  Headache, new or worsening.  Jaw pain. EXAM: CT HEAD WITHOUT CONTRAST TECHNIQUE: Contiguous axial images were obtained from the base of the skull through the vertex without intravenous contrast. COMPARISON:  Brain MRI 08/25/2018 FINDINGS: Brain: No evidence of acute infarction, hemorrhage, hydrocephalus, extra-axial collection or mass lesion/mass effect. Again noted is an empty sella which is similar to the previous MR findings. Vascular: No hyperdense vessel or unexpected calcification. Skull: Normal. Negative for fracture or focal lesion. Normal appearance of the TMJ joints. Sinuses/Orbits: No acute finding. Other: None. IMPRESSION: No acute intracranial abnormality. Electronically Signed   By: Markus Daft M.D.   On: 11/05/2020 14:05    Procedures Procedures   Medications Ordered in ED Medications  sodium chloride 0.9 % bolus 500 mL (0 mLs Intravenous Stopped 11/05/20 1447)  metoCLOPramide (REGLAN) injection 10 mg (10 mg Intravenous Given 11/05/20 1341)  diphenhydrAMINE (BENADRYL) injection 25 mg (25 mg Intravenous  Given 11/05/20 1341)    ED Course  I have reviewed the triage vital signs and the nursing notes.  Pertinent labs & imaging results that were available during my care of the patient were reviewed by me and considered in my medical decision making (see chart for details).    MDM Rules/Calculators/A&P                          Patient presents to the emergency department with chest pain and headache. Symptoms present persistently for the past 3 days, waxing and waning. Patient nontoxic appearing, in no apparent distress, vitals without significant abnormality. Fairly benign physical exam, no focal neurologic deficits. Equal pulses.  Patient's primary cardiologist: None  DDX: ACS, pulmonary embolism, dissection, pneumothorax, pneumonia, arrhythmia, severe anemia, MSK, GERD, anxiety, abdominal process.   Additional history obtained:  Additional history obtained from chart review & nursing note review.   EKG: NSR without concerning ischemic changes  Lab Tests:  I Ordered, reviewed, and interpreted labs, which included:  CBC: no leukocytosis, normal hgb BMP: No significant electrolyte derangements, normal renal function Troponin: neg x 2  Imaging Studies ordered:  I ordered imaging studies which included CXR & CT head, I independently reviewed, formal radiology impression shows:  CXR - no active cardiopulmonary disease CT Head- no acute intracranial abnormality  ED Course:   EKG without obvious acute ischemia, delta troponin negative, doubt ACS. Patient is low risk wells, symptoms very atypical, doubt pulmonary embolism. Pain is not a tearing sensation, symmetric pulses, no widening of mediastinum on CXR, doubt dissection. Cardiac monitor reviewed, no notable arrhythmias or tachycardia. Headache without red flag symptoms, no neuro deficits and HA resolved with treatment in ED. Patient has appeared hemodynamically stable throughout ER visit and appears safe for discharge with close  PCP/cardiology follow up. I discussed results, treatment plan, need for PCP follow-up, and return precautions with the patient. Provided opportunity for questions, patient confirmed understanding and is in agreement with plan.  Portions of this note were generated with [employer]. Dictation errors may occur despite best attempts at proofreading.      Final Clinical Impression(s) / ED Diagnoses Final diagnoses:  Central chest pain  Bad headache    Rx / DC Orders ED Discharge Orders          Ordered    Ambulatory referral to Cardiology        11/05/20 1609             Janet Berlin 11/08/20 1146    Blanchie Dessert, MD 11/12/20 [phone]

## 2020-11-05 NOTE — ED Notes (Signed)
Patient transported to CT 

## 2020-11-05 NOTE — Discharge Instructions (Addendum)
You were seen in the emergency department today for chest pain. Your work-up in the emergency department has been overall reassuring. Your labs have been fairly normal and or similar to previous blood work you have had done. Your EKG and the enzyme we use to check your heart did not show an acute heart attack at this time. Your chest x-ray was normal.   We would like you to follow up closely with your primary care provider and/or the cardiologist provided in your discharge instructions within 1-2 weeks. Return to the ER immediately should you experience any new or worsening symptoms including but not limited to return of pain, worsened pain, vomiting, shortness of breath, dizziness, lightheadedness, passing out, or any other concerns that you may have.

## 2020-11-05 NOTE — ED Triage Notes (Signed)
"  Headache, lower jaw pain, chest pressure. I saw the dentist and they said it was not my teeth. This morning I went to [facility] this morning and they told me to come here" per pt

## 2020-11-22 DIAGNOSIS — R202 Paresthesia of skin: Secondary | ICD-10-CM | POA: Diagnosis not present

## 2020-11-22 DIAGNOSIS — G250 Essential tremor: Secondary | ICD-10-CM | POA: Diagnosis not present

## 2020-11-22 DIAGNOSIS — M542 Cervicalgia: Secondary | ICD-10-CM | POA: Diagnosis not present

## 2020-11-22 DIAGNOSIS — R519 Headache, unspecified: Secondary | ICD-10-CM | POA: Diagnosis not present

## 2020-12-15 DIAGNOSIS — M5412 Radiculopathy, cervical region: Secondary | ICD-10-CM | POA: Diagnosis not present

## 2020-12-15 DIAGNOSIS — M542 Cervicalgia: Secondary | ICD-10-CM | POA: Diagnosis not present

## 2020-12-26 ENCOUNTER — Ambulatory Visit: Payer: PPO | Admitting: Cardiology

## 2021-01-07 DIAGNOSIS — M4802 Spinal stenosis, cervical region: Secondary | ICD-10-CM | POA: Diagnosis not present

## 2021-01-07 DIAGNOSIS — R202 Paresthesia of skin: Secondary | ICD-10-CM | POA: Diagnosis not present

## 2021-01-07 DIAGNOSIS — R252 Cramp and spasm: Secondary | ICD-10-CM | POA: Diagnosis not present

## 2021-01-17 ENCOUNTER — Ambulatory Visit: Payer: PPO | Admitting: Cardiology

## 2021-01-17 ENCOUNTER — Other Ambulatory Visit: Payer: Self-pay

## 2021-01-17 VITALS — BP 125/73 | HR 58 | Ht 63.0 in | Wt 200.2 lb

## 2021-01-17 DIAGNOSIS — I10 Essential (primary) hypertension: Secondary | ICD-10-CM

## 2021-01-17 DIAGNOSIS — E782 Mixed hyperlipidemia: Secondary | ICD-10-CM | POA: Diagnosis not present

## 2021-01-17 DIAGNOSIS — R079 Chest pain, unspecified: Secondary | ICD-10-CM

## 2021-01-17 DIAGNOSIS — Z8249 Family history of ischemic heart disease and other diseases of the circulatory system: Secondary | ICD-10-CM

## 2021-01-17 DIAGNOSIS — E785 Hyperlipidemia, unspecified: Secondary | ICD-10-CM | POA: Insufficient documentation

## 2021-01-17 DIAGNOSIS — R0602 Shortness of breath: Secondary | ICD-10-CM | POA: Diagnosis not present

## 2021-01-17 NOTE — Progress Notes (Signed)
Cardiology Office Note:    Date:  01/17/2021   ID:  Christy Jenkins, DOB [birth year], MRN [id number]  PCP:  Robyne Peers, MD  Cardiologist:  Berniece Salines, DO  Electrophysiologist:  None   Referring MD: Jacqlyn Larsen, PA-C   " I have had some chest pain "  History of Present Illness:    Christy Jenkins is a 70 y.o. female with a hx of hypertension, hyperlipidemia, obesity, family history of premature coronary artery disease in both parents with her mom significant MI at age 33, her dad's CABG was in his early 73s, gestational hypertension.  The patient tells me that back in August she had a significant episode where she had headaches she was experiencing some chest discomfort which felt like a heaviness.  With this she felt sensation of radiation to her jaw or jaw pain.  It was off and on.  With associated shortness of breath.  During that episode she was worked up in the ED EKG was normal troponin was negative.  She was asked to follow-up with cardiology.  She has not had any significant episodes since that time but is rather concerned given her risk factors.  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   ALPRAZolam (XANAX) 0.25 MG tablet Take 0.25 mg by mouth 3 (three) times daily as needed.   Calcium Citrate-Vitamin D 315-5 MG-MCG TABS Take 2 tablets by mouth at bedtime.   cetirizine (ZYRTEC) 10 MG tablet Take by mouth.   Cholecalciferol 25 MCG (1000 UT) tablet Take by mouth.   escitalopram (LEXAPRO) 20 MG tablet Take 20 mg by mouth daily.   ezetimibe  (ZETIA) 10 MG tablet Take by mouth.   fluconazole (DIFLUCAN) 150 MG tablet Take 150 mg by mouth daily as needed.   gabapentin (NEURONTIN) 800 MG tablet Take by mouth.   latanoprost (XALATAN) 0.005 % ophthalmic solution    losartan (COZAAR) 100 MG tablet Take 1 tablet by mouth daily.   Magnesium 250 MG TABS    methocarbamol (ROBAXIN) 500 MG tablet Take 500 mg by mouth as needed.   montelukast (SINGULAIR) 10 MG tablet Take by mouth.   Multiple Vitamin (MULTI-VITAMIN) tablet Take by mouth.   naproxen (NAPROSYN) 500 MG tablet Take by mouth as needed.   pantoprazole (PROTONIX) 40 MG tablet Take 1 tablet by mouth 2 (two) times daily.   primidone (MYSOLINE) 50 MG tablet TAKE 1 TO 2 TABLETS BY MOUTH NIGHTLY   tiZANidine (ZANAFLEX) 2 MG tablet Take 1-2 tabs twice daily as needed for pain, insomnia.   vitamin B-12 (CYANOCOBALAMIN) 500 MCG tablet    zonisamide (ZONEGRAN) 100 MG capsule at bedtime as needed.   [DISCONTINUED] losartan (COZAAR) 100 MG tablet Take 100 mg by mouth daily.   [DISCONTINUED] losartan-hydrochlorothiazide (HYZAAR) 100-12.5 MG tablet Take 1 tablet by mouth daily.     Allergies:   Statins and Tape   Social History   Socioeconomic History   Marital status:  Single    Spouse name: Not on file   Number of children: Not on file   Years of education: Not on file   Highest education level: Not on file  Occupational History   Not on file  Tobacco Use   Smoking status: Former    Types: Cigarettes   Smokeless tobacco: Not on file  Substance and Sexual Activity   Alcohol use: Not Currently   Drug use: Not on file   Sexual activity: Not on file  Other Topics Concern   Not on file  Social History Narrative   Not on file   Social Determinants of Health   Financial Resource Strain: Not on file  Food Insecurity: Not on file  Transportation Needs: Not on file  Physical Activity: Not on file  Stress: Not on file  Social Connections: Not on file     Family History: The  patient's family history is not on file.  ROS:   Review of Systems  Constitution: Negative for decreased appetite, fever and weight gain.  HENT: Negative for congestion, ear discharge, hoarse voice and sore throat.   Eyes: Negative for discharge, redness, vision loss in right eye and visual halos.  Cardiovascular: Recent chest pain, dyspnea on exertion, leg swelling, orthopnea and palpitations.  Respiratory: Negative for cough, hemoptysis, shortness of breath and snoring.   Endocrine: Negative for heat intolerance and polyphagia.  Hematologic/Lymphatic: Negative for bleeding problem. Does not bruise/bleed easily.  Skin: Negative for flushing, nail changes, rash and suspicious lesions.  Musculoskeletal: Negative for arthritis, joint pain, muscle cramps, myalgias, neck pain and stiffness.  Gastrointestinal: Negative for abdominal pain, bowel incontinence, diarrhea and excessive appetite.  Genitourinary: Negative for decreased libido, genital sores and incomplete emptying.  Neurological: Negative for brief paralysis, focal weakness, headaches and loss of balance.  Psychiatric/Behavioral: Negative for altered mental status, depression and suicidal ideas.  Allergic/Immunologic: Negative for HIV exposure and persistent infections.    EKGs/Labs/Other Studies Reviewed:    The following studies were reviewed today:   EKG:  The ekg ordered today demonstrates sinus bradycardia, heart rate 58 bpm.  Recent Labs: 11/05/2020: BUN 11; Creatinine, Ser 0.71; Hemoglobin 13.3; Platelets 240; Potassium 4.3; Sodium 139  Recent Lipid Panel No results found for: CHOL, TRIG, HDL, CHOLHDL, VLDL, LDLCALC, LDLDIRECT  Physical Exam:    VS:  BP 125/73   Pulse (!) 58   Ht 5\' 3"  (1.6 m)   Wt 200 lb 3.2 oz (90.8 kg)   SpO2 97%   BMI 35.46 kg/m     Wt Readings from Last 3 Encounters:  01/17/21 200 lb 3.2 oz (90.8 kg)  11/05/20 200 lb (90.7 kg)     GEN: Well nourished, well developed in no acute  distress HEENT: Normal NECK: No JVD; No carotid bruits LYMPHATICS: No lymphadenopathy CARDIAC: S1S2 noted,RRR, no murmurs, rubs, gallops RESPIRATORY:  Clear to auscultation without rales, wheezing or rhonchi  ABDOMEN: Soft, non-tender, non-distended, +bowel sounds, no guarding. EXTREMITIES: No edema, No cyanosis, no clubbing MUSCULOSKELETAL:  No deformity  SKIN: Warm and dry NEUROLOGIC:  Alert and oriented x 3, non-focal PSYCHIATRIC:  Normal affect, good insight  ASSESSMENT:    1. Chest pain of uncertain etiology   2. SOB (shortness of breath)   3. Primary hypertension   4. Mixed hyperlipidemia   5. Morbid obesity ([place])   6. Family history of premature CAD    PLAN:    The symptoms chest pain is concerning, this patient does have intermediate risk for coronary  artery disease and at this time I would like to pursue an ischemic evaluation in this patient.  Shared decision a coronary CTA at this time is appropriate.  I have discussed with the patient about the testing.  The patient has no IV contrast allergy and is agreeable to proceed with this test.  In addition, she has some shortness of breath a longstanding hypertension I would like to get an echocardiogram to rule out any diastolic dysfunction.  Blood pressure is acceptable, continue with current antihypertensive regimen.  Hyperlipidemia - continue with current statin medication.  The patient is in agreement with the above plan. The patient left the office in stable condition.  The patient will follow up in 4 months or sooner if needed.   Medication Adjustments/Labs and Tests Ordered: Current medicines are reviewed at length with the patient today.  Concerns regarding medicines are outlined above.  Orders Placed This Encounter  Procedures   CT CORONARY MORPH W/CTA COR W/SCORE W/CA W/CM &/OR WO/CM   Basic Metabolic Panel (BMET)   Magnesium   EKG 12-Lead   ECHOCARDIOGRAM COMPLETE   No orders of the defined types were  placed in this encounter.   Patient Instructions  Medication Instructions:  Your physician recommends that you continue on your current medications as directed. Please refer to the Current Medication list given to you today.  *If you need a refill on your cardiac medications before your next appointment, please call your pharmacy*   Lab Work: Your physician recommends that you return for lab work in:  3-7 days before CT scan: BMET, Mag If you have labs (blood work) drawn today and your tests are completely normal, you will receive your results only by: [place] (if you have MyChart) OR A paper copy in the mail If you have any lab test that is abnormal or we need to change your treatment, we will call you to review the results.   Testing/Procedures:   Your cardiac CT will be scheduled at one of the below locations:   [facility] [address] [place], [place] [address] [phone]   If scheduled at [facility], please arrive at the [facility] main entrance (entrance A) of [facility] 30 minutes prior to test start time. You can use the FREE valet parking offered at the main entrance (encouraged to control the heart rate for the test) Proceed to the [facility] Radiology Department (first floor) to check-in and test prep.  Please follow these instructions carefully (unless otherwise directed):  On the Night Before the Test: Be sure to Drink plenty of water. Do not consume any caffeinated/decaffeinated beverages or chocolate 12 hours prior to your test. Do not take any antihistamines 12 hours prior to your test.   On the Day of the Test: Drink plenty of water until 1 hour prior to the test. Do not eat any food 4 hours prior to the test. You may take your regular medications prior to the test.  FEMALES- please wear underwire-free bra if available, avoid dresses & tight clothing        After the Test: Drink plenty of water. After  receiving IV contrast, you may experience a mild flushed feeling. This is normal. On occasion, you may experience a mild rash up to 24 hours after the test. This is not dangerous. If this occurs, you can take Benadryl 25 mg and increase your fluid intake. If you experience trouble breathing, this can be serious. If it  is severe call 911 IMMEDIATELY. If it is mild, please call our office. If you take any of these medications: Glipizide/Metformin, Avandament, Glucavance, please do not take 48 hours after completing test unless otherwise instructed.  Please allow 2-4 weeks for scheduling of routine cardiac CTs. Some insurance companies require a pre-authorization which may delay scheduling of this test.   For non-scheduling related questions, please contact the cardiac imaging nurse navigator should you have any questions/concerns: Marchia Bond, Cardiac Imaging Nurse Navigator Gordy Clement, Cardiac Imaging Nurse Navigator [facility] Direct Office Dial: [phone]   For scheduling needs, including cancellations and rescheduling, please call [place], [phone].  Your physician has requested that you have an echocardiogram. Echocardiography is a painless test that uses sound waves to create images of your heart. It provides your doctor with information about the size and shape of your heart and how well your heart's chambers and valves are working. This procedure takes approximately one hour. There are no restrictions for this procedure.    Follow-Up: At [facility], you and your health needs are our priority.  As part of our continuing mission to provide you with exceptional heart care, we have created designated Provider Care Teams.  These Care Teams include your primary Cardiologist (physician) and Advanced Practice Providers (APPs -  Physician Assistants and Nurse Practitioners) who all work together to provide you with the care you need, when you need  it.  We recommend signing up for the patient portal called "MyChart".  Sign up information is provided on this After Visit Summary.  MyChart is used to connect with patients for Virtual Visits (Telemedicine).  Patients are able to view lab/test results, encounter notes, upcoming appointments, etc.  Non-urgent messages can be sent to your provider as well.   To learn more about what you can do with MyChart, go to [email or web address].    Your next appointment:   4 month(s)  The format for your next appointment:   In Person  Provider:   Berniece Salines, DO [id number], [place], [place] [address]    Other Instructions Echocardiogram An echocardiogram is a test that uses sound waves (ultrasound) to produce images of the heart. Images from an echocardiogram can provide important information about: Heart size and shape. The size and thickness and movement of your heart's walls. Heart muscle function and strength. Heart valve function or if you have stenosis. Stenosis is when the heart valves are too narrow. If blood is flowing backward through the heart valves (regurgitation). A tumor or infectious growth around the heart valves. Areas of heart muscle that are not working well because of poor blood flow or injury from a heart attack. Aneurysm detection. An aneurysm is a weak or damaged part of an artery wall. The wall bulges out from the normal force of blood pumping through the body. Tell a health care provider about: Any allergies you have. All medicines you are taking, including vitamins, herbs, eye drops, creams, and over-the-counter medicines. Any blood disorders you have. Any surgeries you have had. Any medical conditions you have. Whether you are pregnant or may be pregnant. What are the risks? Generally, this is a safe test. However, problems may occur, including an allergic reaction to dye (contrast) that may be used during the test. What happens before the test? No  specific preparation is needed. You may eat and drink normally. What happens during the test?  You will take off your clothes from the waist up and put  on a hospital gown. Electrodes or electrocardiogram (ECG)patches may be placed on your chest. The electrodes or patches are then connected to a device that monitors your heart rate and rhythm. You will lie down on a table for an ultrasound exam. A gel will be applied to your chest to help sound waves pass through your skin. A handheld device, called a transducer, will be pressed against your chest and moved over your heart. The transducer produces sound waves that travel to your heart and bounce back (or "echo" back) to the transducer. These sound waves will be captured in real-time and changed into images of your heart that can be viewed on a video monitor. The images will be recorded on a computer and reviewed by your health care provider. You may be asked to change positions or hold your breath for a short time. This makes it easier to get different views or better views of your heart. In some cases, you may receive contrast through an IV in one of your veins. This can improve the quality of the pictures from your heart. The procedure may vary among health care providers and hospitals. What can I expect after the test? You may return to your normal, everyday life, including diet, activities, and medicines, unless your health care provider tells you not to do that. Follow these instructions at home: It is up to you to get the results of your test. Ask your health care provider, or the department that is doing the test, when your results will be ready. Keep all follow-up visits. This is important. Summary An echocardiogram is a test that uses sound waves (ultrasound) to produce images of the heart. Images from an echocardiogram can provide important information about the size and shape of your heart, heart muscle function, heart valve function, and  other possible heart problems. You do not need to do anything to prepare before this test. You may eat and drink normally. After the echocardiogram is completed, you may return to your normal, everyday life, unless your health care provider tells you not to do that. This information is not intended to replace advice given to you by your health care provider. Make sure you discuss any questions you have with your health care provider. Document Revised: 11/08/2019 Document Reviewed: 11/08/2019 Elsevier Patient Education  2022 [employer].     Adopting a Healthy Lifestyle.  Know what a healthy weight is for you (roughly BMI <25) and aim to maintain this   Aim for 7+ servings of fruits and vegetables daily   65-80+ fluid ounces of water or unsweet tea for healthy kidneys   Limit to max 1 drink of alcohol per day; avoid smoking/tobacco   Limit animal fats in diet for cholesterol and heart health - choose grass fed whenever available   Avoid highly processed foods, and foods high in saturated/trans fats   Aim for low stress - take time to unwind and care for your mental health   Aim for 150 min of moderate intensity exercise weekly for heart health, and weights twice weekly for bone health   Aim for 7-9 hours of sleep daily   When it comes to diets, agreement about the perfect plan isnt easy to find, even among the experts. Experts at the [employer] developed an idea known as the Healthy Eating Plate. Just imagine a plate divided into logical, healthy portions.   The emphasis is on diet quality:   Load up on vegetables and  fruits - one-half of your plate: Aim for color and variety, and remember that potatoes dont count.   Go for whole grains - one-quarter of your plate: Whole wheat, barley, wheat berries, quinoa, oats, brown rice, and foods made with them. If you want pasta, go with whole wheat pasta.   Protein power - one-quarter of your plate: Fish, chicken,  beans, and nuts are all healthy, versatile protein sources. Limit red meat.   The diet, however, does go beyond the plate, offering a few other suggestions.   Use healthy plant oils, such as olive, canola, soy, corn, sunflower and peanut. Check the labels, and avoid partially hydrogenated oil, which have unhealthy trans fats.   If youre thirsty, drink water. Coffee and tea are good in moderation, but skip sugary drinks and limit milk and dairy products to one or two daily servings.   The type of carbohydrate in the diet is more important than the amount. Some sources of carbohydrates, such as vegetables, fruits, whole grains, and beans-are healthier than others.   Finally, stay active  Signed, Berniece Salines, DO  01/17/2021 5:06 PM    [facility]

## 2021-01-17 NOTE — Patient Instructions (Addendum)
Medication Instructions:  Your physician recommends that you continue on your current medications as directed. Please refer to the Current Medication list given to you today.  *If you need a refill on your cardiac medications before your next appointment, please call your pharmacy*   Lab Work: Your physician recommends that you return for lab work in:  3-7 days before CT scan: BMET, Mag If you have labs (blood work) drawn today and your tests are completely normal, you will receive your results only by: [place] (if you have MyChart) OR A paper copy in the mail If you have any lab test that is abnormal or we need to change your treatment, we will call you to review the results.   Testing/Procedures:   Your cardiac CT will be scheduled at one of the below locations:   [facility] [address] [place], [place] [address] [phone]   If scheduled at [facility], please arrive at the [facility] main entrance (entrance A) of [facility] 30 minutes prior to test start time. You can use the FREE valet parking offered at the main entrance (encouraged to control the heart rate for the test) Proceed to the [facility] Department (first floor) to check-in and test prep.  Please follow these instructions carefully (unless otherwise directed):  On the Night Before the Test: Be sure to Drink plenty of water. Do not consume any caffeinated/decaffeinated beverages or chocolate 12 hours prior to your test. Do not take any antihistamines 12 hours prior to your test.   On the Day of the Test: Drink plenty of water until 1 hour prior to the test. Do not eat any food 4 hours prior to the test. You may take your regular medications prior to the test.  FEMALES- please wear underwire-free bra if available, avoid dresses & tight clothing        After the Test: Drink plenty of water. After receiving IV contrast, you may experience a mild flushed  feeling. This is normal. On occasion, you may experience a mild rash up to 24 hours after the test. This is not dangerous. If this occurs, you can take Benadryl 25 mg and increase your fluid intake. If you experience trouble breathing, this can be serious. If it is severe call 911 IMMEDIATELY. If it is mild, please call our office. If you take any of these medications: Glipizide/Metformin, Avandament, Glucavance, please do not take 48 hours after completing test unless otherwise instructed.  Please allow 2-4 weeks for scheduling of routine cardiac CTs. Some insurance companies require a pre-authorization which may delay scheduling of this test.   For non-scheduling related questions, please contact the cardiac imaging nurse navigator should you have any questions/concerns: Marchia Bond, Cardiac Imaging Nurse Navigator Gordy Clement, Cardiac Imaging Nurse Navigator [facility] Direct Office Dial: [phone]   For scheduling needs, including cancellations and rescheduling, please call [place], [phone].  Your physician has requested that you have an echocardiogram. Echocardiography is a painless test that uses sound waves to create images of your heart. It provides your doctor with information about the size and shape of your heart and how well your heart's chambers and valves are working. This procedure takes approximately one hour. There are no restrictions for this procedure.    Follow-Up: At [facility], you and your health needs are our priority.  As part of our continuing mission to provide you with exceptional heart care, we have created designated  Provider Care Teams.  These Care Teams include your primary Cardiologist (physician) and Advanced Practice Providers (APPs -  Physician Assistants and Nurse Practitioners) who all work together to provide you with the care you need, when you need it.  We recommend signing up for the patient portal called  "MyChart".  Sign up information is provided on this After Visit Summary.  MyChart is used to connect with patients for Virtual Visits (Telemedicine).  Patients are able to view lab/test results, encounter notes, upcoming appointments, etc.  Non-urgent messages can be sent to your provider as well.   To learn more about what you can do with MyChart, go to [email or web address].    Your next appointment:   4 month(s)  The format for your next appointment:   In Person  Provider:   Berniece Salines, DO [address], [place], [place] [address]    Other Instructions Echocardiogram An echocardiogram is a test that uses sound waves (ultrasound) to produce images of the heart. Images from an echocardiogram can provide important information about: Heart size and shape. The size and thickness and movement of your heart's walls. Heart muscle function and strength. Heart valve function or if you have stenosis. Stenosis is when the heart valves are too narrow. If blood is flowing backward through the heart valves (regurgitation). A tumor or infectious growth around the heart valves. Areas of heart muscle that are not working well because of poor blood flow or injury from a heart attack. Aneurysm detection. An aneurysm is a weak or damaged part of an artery wall. The wall bulges out from the normal force of blood pumping through the body. Tell a health care provider about: Any allergies you have. All medicines you are taking, including vitamins, herbs, eye drops, creams, and over-the-counter medicines. Any blood disorders you have. Any surgeries you have had. Any medical conditions you have. Whether you are pregnant or may be pregnant. What are the risks? Generally, this is a safe test. However, problems may occur, including an allergic reaction to dye (contrast) that may be used during the test. What happens before the test? No specific preparation is needed. You may eat and drink  normally. What happens during the test?  You will take off your clothes from the waist up and put on a hospital gown. Electrodes or electrocardiogram (ECG)patches may be placed on your chest. The electrodes or patches are then connected to a device that monitors your heart rate and rhythm. You will lie down on a table for an ultrasound exam. A gel will be applied to your chest to help sound waves pass through your skin. A handheld device, called a transducer, will be pressed against your chest and moved over your heart. The transducer produces sound waves that travel to your heart and bounce back (or "echo" back) to the transducer. These sound waves will be captured in real-time and changed into images of your heart that can be viewed on a video monitor. The images will be recorded on a computer and reviewed by your health care provider. You may be asked to change positions or hold your breath for a short time. This makes it easier to get different views or better views of your heart. In some cases, you may receive contrast through an IV in one of your veins. This can improve the quality of the pictures from your heart. The procedure may vary among health care providers and hospitals. What can I expect after the test? You may return  to your normal, everyday life, including diet, activities, and medicines, unless your health care provider tells you not to do that. Follow these instructions at home: It is up to you to get the results of your test. Ask your health care provider, or the department that is doing the test, when your results will be ready. Keep all follow-up visits. This is important. Summary An echocardiogram is a test that uses sound waves (ultrasound) to produce images of the heart. Images from an echocardiogram can provide important information about the size and shape of your heart, heart muscle function, heart valve function, and other possible heart problems. You do not need to do  anything to prepare before this test. You may eat and drink normally. After the echocardiogram is completed, you may return to your normal, everyday life, unless your health care provider tells you not to do that. This information is not intended to replace advice given to you by your health care provider. Make sure you discuss any questions you have with your health care provider. Document Revised: 11/08/2019 Document Reviewed: 11/08/2019 Elsevier Patient Education  2022 [employer].

## 2021-01-21 DIAGNOSIS — R0602 Shortness of breath: Secondary | ICD-10-CM | POA: Diagnosis not present

## 2021-01-21 DIAGNOSIS — R079 Chest pain, unspecified: Secondary | ICD-10-CM | POA: Diagnosis not present

## 2021-01-22 LAB — BASIC METABOLIC PANEL
BUN/Creatinine Ratio: 14 (ref 12–28)
BUN: 12 mg/dL (ref 8–27)
CO2: 25 mmol/L (ref 20–29)
Calcium: 9.1 mg/dL (ref 8.7–10.3)
Chloride: 104 mmol/L (ref 96–106)
Creatinine, Ser: 0.83 mg/dL (ref 0.57–1.00)
Glucose: 79 mg/dL (ref 70–99)
Potassium: 4.2 mmol/L (ref 3.5–5.2)
Sodium: 140 mmol/L (ref 134–144)
eGFR: 76 mL/min/{1.73_m2} (ref >59)

## 2021-01-22 LAB — MAGNESIUM: Magnesium: 2.1 mg/dL (ref 1.6–2.3)

## 2021-01-25 ENCOUNTER — Telehealth (HOSPITAL_COMMUNITY): Payer: Self-pay | Admitting: *Deleted

## 2021-01-25 NOTE — Telephone Encounter (Signed)
Reaching out to patient to offer assistance regarding upcoming cardiac imaging study; pt verbalizes understanding of appt date/time, parking situation and where to check in, pre-test NPO status, and verified current allergies; name and call back number provided for further questions should they arise  Lorrie Strauch RN Navigator Cardiac Imaging [facility] [phone] office [phone] cell  

## 2021-01-28 ENCOUNTER — Other Ambulatory Visit: Payer: Self-pay

## 2021-01-28 ENCOUNTER — Ambulatory Visit (HOSPITAL_COMMUNITY)
Admission: RE | Admit: 2021-01-28 | Discharge: 2021-01-28 | Disposition: A | Discharge status: Home / Self Care | Payer: PPO | Source: Ambulatory Visit | Attending: Cardiology | Admitting: Cardiology

## 2021-01-28 ENCOUNTER — Other Ambulatory Visit (HOSPITAL_COMMUNITY): Payer: Self-pay | Admitting: Emergency Medicine

## 2021-01-28 DIAGNOSIS — R931 Abnormal findings on diagnostic imaging of heart and coronary circulation: Secondary | ICD-10-CM

## 2021-01-28 DIAGNOSIS — R079 Chest pain, unspecified: Secondary | ICD-10-CM | POA: Insufficient documentation

## 2021-01-28 DIAGNOSIS — I700 Atherosclerosis of aorta: Secondary | ICD-10-CM | POA: Insufficient documentation

## 2021-01-28 DIAGNOSIS — K449 Diaphragmatic hernia without obstruction or gangrene: Secondary | ICD-10-CM | POA: Diagnosis not present

## 2021-01-28 DIAGNOSIS — I2510 Atherosclerotic heart disease of native coronary artery without angina pectoris: Secondary | ICD-10-CM | POA: Diagnosis not present

## 2021-01-28 MED ORDER — NITROGLYCERIN 0.4 MG SL SUBL
SUBLINGUAL_TABLET | SUBLINGUAL | Status: AC
  Filled 2021-01-28: qty 2

## 2021-01-28 MED ORDER — NITROGLYCERIN 0.4 MG SL SUBL
0.8000 mg | SUBLINGUAL_TABLET | Freq: Once | SUBLINGUAL | Status: AC
  Administered 2021-01-28: 0.8 mg via SUBLINGUAL

## 2021-01-28 MED ORDER — IOHEXOL 350 MG/ML SOLN
95.0000 mL | Freq: Once | INTRAVENOUS | Status: AC | PRN
  Administered 2021-01-28: 95 mL via INTRAVENOUS

## 2021-01-29 DIAGNOSIS — I2510 Atherosclerotic heart disease of native coronary artery without angina pectoris: Secondary | ICD-10-CM | POA: Diagnosis not present

## 2021-01-31 ENCOUNTER — Other Ambulatory Visit: Payer: Self-pay

## 2021-01-31 DIAGNOSIS — E782 Mixed hyperlipidemia: Secondary | ICD-10-CM

## 2021-01-31 DIAGNOSIS — M5412 Radiculopathy, cervical region: Secondary | ICD-10-CM | POA: Diagnosis not present

## 2021-01-31 DIAGNOSIS — Z789 Other specified health status: Secondary | ICD-10-CM

## 2021-01-31 MED ORDER — ASPIRIN EC 81 MG PO TBEC: 81.0000 mg | DELAYED_RELEASE_TABLET | Freq: Every day | ORAL | 3 refills | Status: AC

## 2021-01-31 NOTE — Progress Notes (Signed)
Referral made for Lipid clinic.

## 2021-02-05 ENCOUNTER — Other Ambulatory Visit: Payer: Self-pay

## 2021-02-05 ENCOUNTER — Ambulatory Visit (HOSPITAL_COMMUNITY): Payer: PPO | Attending: Cardiology

## 2021-02-05 DIAGNOSIS — R0602 Shortness of breath: Secondary | ICD-10-CM | POA: Insufficient documentation

## 2021-02-05 LAB — ECHOCARDIOGRAM COMPLETE
Area-P 1/2: 4.39 cm2
S' Lateral: 3.1 cm

## 2021-02-15 DIAGNOSIS — H40011 Open angle with borderline findings, low risk, right eye: Secondary | ICD-10-CM | POA: Diagnosis not present

## 2021-02-25 DIAGNOSIS — M8588 Other specified disorders of bone density and structure, other site: Secondary | ICD-10-CM | POA: Diagnosis not present

## 2021-02-25 DIAGNOSIS — Z79890 Hormone replacement therapy (postmenopausal): Secondary | ICD-10-CM | POA: Diagnosis not present

## 2021-02-25 DIAGNOSIS — Z1231 Encounter for screening mammogram for malignant neoplasm of breast: Secondary | ICD-10-CM | POA: Diagnosis not present

## 2021-02-25 DIAGNOSIS — M47816 Spondylosis without myelopathy or radiculopathy, lumbar region: Secondary | ICD-10-CM | POA: Diagnosis not present

## 2021-02-25 DIAGNOSIS — Z780 Asymptomatic menopausal state: Secondary | ICD-10-CM | POA: Diagnosis not present

## 2021-02-26 DIAGNOSIS — J0100 Acute maxillary sinusitis, unspecified: Secondary | ICD-10-CM | POA: Diagnosis not present

## 2021-02-26 DIAGNOSIS — R0981 Nasal congestion: Secondary | ICD-10-CM | POA: Diagnosis not present

## 2021-02-26 DIAGNOSIS — J111 Influenza due to unidentified influenza virus with other respiratory manifestations: Secondary | ICD-10-CM | POA: Diagnosis not present

## 2021-02-26 DIAGNOSIS — R051 Acute cough: Secondary | ICD-10-CM | POA: Diagnosis not present

## 2021-02-26 DIAGNOSIS — J029 Acute pharyngitis, unspecified: Secondary | ICD-10-CM | POA: Diagnosis not present

## 2021-02-26 DIAGNOSIS — Z20828 Contact with and (suspected) exposure to other viral communicable diseases: Secondary | ICD-10-CM | POA: Diagnosis not present

## 2021-03-21 DIAGNOSIS — Z20828 Contact with and (suspected) exposure to other viral communicable diseases: Secondary | ICD-10-CM | POA: Diagnosis not present

## 2021-03-21 DIAGNOSIS — J45901 Unspecified asthma with (acute) exacerbation: Secondary | ICD-10-CM | POA: Diagnosis not present

## 2021-03-21 DIAGNOSIS — R051 Acute cough: Secondary | ICD-10-CM | POA: Diagnosis not present

## 2021-03-21 DIAGNOSIS — R509 Fever, unspecified: Secondary | ICD-10-CM | POA: Diagnosis not present

## 2021-03-21 DIAGNOSIS — M791 Myalgia, unspecified site: Secondary | ICD-10-CM | POA: Diagnosis not present

## 2021-04-08 ENCOUNTER — Encounter: Payer: Self-pay | Admitting: Cardiology

## 2021-05-22 ENCOUNTER — Other Ambulatory Visit: Payer: Self-pay

## 2021-05-22 ENCOUNTER — Encounter: Payer: Self-pay | Admitting: Cardiology

## 2021-05-22 ENCOUNTER — Ambulatory Visit: Payer: PPO | Admitting: Cardiology

## 2021-05-22 VITALS — BP 140/70 | HR 61 | Ht 62.0 in | Wt 199.2 lb

## 2021-05-22 DIAGNOSIS — K7689 Other specified diseases of liver: Secondary | ICD-10-CM | POA: Diagnosis not present

## 2021-05-22 DIAGNOSIS — I2510 Atherosclerotic heart disease of native coronary artery without angina pectoris: Secondary | ICD-10-CM

## 2021-05-22 DIAGNOSIS — K449 Diaphragmatic hernia without obstruction or gangrene: Secondary | ICD-10-CM | POA: Diagnosis not present

## 2021-05-22 DIAGNOSIS — E785 Hyperlipidemia, unspecified: Secondary | ICD-10-CM | POA: Diagnosis not present

## 2021-05-22 DIAGNOSIS — E669 Obesity, unspecified: Secondary | ICD-10-CM | POA: Insufficient documentation

## 2021-05-22 DIAGNOSIS — I700 Atherosclerosis of aorta: Secondary | ICD-10-CM

## 2021-05-22 NOTE — Progress Notes (Signed)
Cardiology Office Note:    Date:  05/22/2021   ID:  Christy Jenkins, DOB [birth year], MRN [id number]  PCP:  Robyne Peers, MD  Cardiologist:  Berniece Salines, DO  Electrophysiologist:  None   Referring MD: Robyne Peers, MD   " I am doing ok"  History of Present Illness:    Christy Jenkins is a 71 y.o. female with a hx of hypertension, hyperlipidemia, obesity is here today for a follow up visit.   First saw the patient January 18, 2019 at that time she she was experiencing chest discomfort.  She also has significant shortness of breath. The patient for coronary CT scan as well as an echocardiogram.  She was able to get this testing done.  If you please send information to the patient to pulmonary for further testing.  She is here today for follow-up visit.  She denies any chest pain.  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   ALPRAZolam (XANAX) 0.25 MG tablet Take 0.25 mg by mouth 3 (three) times daily as needed.   aspirin EC 81 MG tablet Take 1 tablet (81 mg total) by mouth daily. Swallow whole.   Calcium Citrate-Vitamin D 315-5 MG-MCG TABS Take 2 tablets by mouth at bedtime.   cetirizine (ZYRTEC) 10 MG tablet Take by mouth.   Cholecalciferol 25 MCG (1000 UT) tablet Take by mouth.   escitalopram (LEXAPRO) 20 MG tablet Take 20 mg by mouth daily.   ezetimibe (ZETIA) 10 MG tablet Take by mouth.   gabapentin (NEURONTIN) 800 MG tablet Take by mouth.   latanoprost (XALATAN) 0.005 % ophthalmic solution    losartan (COZAAR) 100 MG tablet Take 1 tablet by  mouth daily.   Magnesium 250 MG TABS    methocarbamol (ROBAXIN) 500 MG tablet Take 500 mg by mouth as needed.   montelukast (SINGULAIR) 10 MG tablet Take by mouth.   Multiple Vitamin (MULTI-VITAMIN) tablet Take by mouth.   naproxen (NAPROSYN) 500 MG tablet Take by mouth as needed.   pantoprazole (PROTONIX) 40 MG tablet Take 1 tablet by mouth 2 (two) times daily.   primidone (MYSOLINE) 50 MG tablet TAKE 1 TO 2 TABLETS BY MOUTH NIGHTLY   tiZANidine (ZANAFLEX) 2 MG tablet Take 1-2 tabs twice daily as needed for pain, insomnia.   vitamin B-12 (CYANOCOBALAMIN) 500 MCG tablet    zonisamide (ZONEGRAN) 100 MG capsule at bedtime as needed.     Allergies:   Other, Statins, and Tape   Social History   Socioeconomic History   Marital status: Single    Spouse name: Not on file   Number of children: Not on file   Years of education: Not on file   Highest education level: Not on file  Occupational History   Not on file  Tobacco Use   Smoking status: Former    Types: Cigarettes   Smokeless tobacco: Not on file  Substance and Sexual Activity   Alcohol use: Not Currently  Drug use: Not on file   Sexual activity: Not on file  Other Topics Concern   Not on file  Social History Narrative   Not on file   Social Determinants of Health   Financial Resource Strain: Not on file  Food Insecurity: Not on file  Transportation Needs: Not on file  Physical Activity: Not on file  Stress: Not on file  Social Connections: Not on file     Family History: The patient's family history is not on file.  ROS:   Review of Systems  Constitution: Negative for decreased appetite, fever and weight gain.  HENT: Negative for congestion, ear discharge, hoarse voice and sore throat.   Eyes: Negative for discharge, redness, vision loss in right eye and visual halos.  Cardiovascular: Negative for chest pain, dyspnea on exertion, leg swelling, orthopnea and palpitations.  Respiratory: Negative for cough,  hemoptysis, shortness of breath and snoring.   Endocrine: Negative for heat intolerance and polyphagia.  Hematologic/Lymphatic: Negative for bleeding problem. Does not bruise/bleed easily.  Skin: Negative for flushing, nail changes, rash and suspicious lesions.  Musculoskeletal: Negative for arthritis, joint pain, muscle cramps, myalgias, neck pain and stiffness.  Gastrointestinal: Negative for abdominal pain, bowel incontinence, diarrhea and excessive appetite.  Genitourinary: Negative for decreased libido, genital sores and incomplete emptying.  Neurological: Negative for brief paralysis, focal weakness, headaches and loss of balance.  Psychiatric/Behavioral: Negative for altered mental status, depression and suicidal ideas.  Allergic/Immunologic: Negative for HIV exposure and persistent infections.    EKGs/Labs/Other Studies Reviewed:    The following studies were reviewed today:   EKG:  None today   TTE 02/05/2021 IMPRESSIONS   1. Probable liver cyst; suggest abdominal ultrasound to further assess.   2. Left ventricular ejection fraction, by estimation, is 60 to 65%. The left ventricle has normal function. The left ventricle has no regional  wall motion abnormalities. Left ventricular diastolic parameters were normal. The average left ventricular  global longitudinal strain is -23.1 %. The global longitudinal strain is normal.   3. Right ventricular systolic function is normal. The right ventricular size is normal. There is normal pulmonary artery systolic pressure.   4. The mitral valve is normal in structure. Trivial mitral valve  regurgitation. No evidence of mitral stenosis.   5. The aortic valve is tricuspid. Aortic valve regurgitation is not  visualized. No aortic stenosis is present.   6. The inferior vena cava is normal in size with greater than 50%  respiratory variability, suggesting right atrial pressure of 3 mmHg.   Comparison(s): No prior Echocardiogram.   FINDINGS    Left Ventricle: Left ventricular ejection fraction, by estimation, is 60  to 65%. The left ventricle has normal function. The left ventricle has no  regional wall motion abnormalities. The average left ventricular global  longitudinal strain is -23.1 %.  The global longitudinal strain is normal. The left ventricular internal  cavity size was normal in size. There is no left ventricular hypertrophy.  Left ventricular diastolic parameters were normal.   Right Ventricle: The right ventricular size is normal.Right ventricular  systolic function is normal. There is normal pulmonary artery systolic  pressure. The tricuspid regurgitant velocity is 2.30 m/s, and with an  assumed right atrial pressure of 3 mmHg,  the estimated right ventricular systolic pressure is 75.1 mmHg.   Left Atrium: Left atrial size was normal in size.   Right Atrium: Right atrial size was normal in size.   Pericardium: There is no evidence of pericardial  effusion.   Mitral Valve: The mitral valve is normal in structure. Trivial mitral  valve regurgitation. No evidence of mitral valve stenosis.   Tricuspid Valve: The tricuspid valve is normal in structure. Tricuspid  valve regurgitation is mild . No evidence of tricuspid stenosis.   Aortic Valve: The aortic valve is tricuspid. Aortic valve regurgitation is  not visualized. No aortic stenosis is present.   Pulmonic Valve: The pulmonic valve was normal in structure. Pulmonic valve  regurgitation is not visualized. No evidence of pulmonic stenosis.   Aorta: The aortic root is normal in size and structure.   Venous: The inferior vena cava is normal in size with greater than 50%  respiratory variability, suggesting right atrial pressure of 3 mmHg.   IAS/Shunts: No atrial level shunt detected by color flow Doppler.   Additional Comments: Probable liver cyst; suggest abdominal ultrasound to  further assess.   CCTA 01/28/2021 FINDINGS: Non-cardiac: See separate  report from [facility].   No LA appendage thrombus. Pulmonary veins drain normally to the left atrium.   Calcium Score: 939 Agatston units.   Coronary Arteries: Right dominant with no anomalies   LM: Calcified plaque mid-distal left main, mild (<50%) stenosis.   LAD system: Calcified plaque proximal and mid LAD, mild (<50%) stenosis.   Circumflex system: Calicifed plaque mid LCx, minimal stenosis.   RCA system: Mixed plaque proximal RCA, mild (<50%) stenosis. Mixed plaque distal RCA, mild (<50%) stenosis.   IMPRESSION: 1. Coronary artery calcium score 939 Agatston units. This places the patient in the 97th percentile for age and gender, suggesting high risk for future cardiac events.   2. Extensive calcified plaque left main, proximal/mid LAD, and RCA. Suspect only mild stenosis, but given significant calcification with blooming artifact, will send for FFR.   Dalton Mclean     Electronically Signed   By: Loralie Champagne M.D.   On: 01/28/2021 14:42    Addended by Larey Dresser, MD on 01/28/2021  2:44 PM   Study Result  Narrative & Impression  EXAM: OVER-READ INTERPRETATION  CT CHEST   The following report is an over-read performed by radiologist Dr. Vinnie Langton of [facility], [place] on 01/28/2021. This over-read does not include interpretation of cardiac or coronary anatomy or pathology. The coronary calcium score/coronary CTA interpretation by the cardiologist is attached.   COMPARISON:  None.   FINDINGS: Within the visualized portions of the thorax there are no suspicious appearing pulmonary nodules or masses, there is no acute consolidative airspace disease, no pleural effusions, no pneumothorax and no lymphadenopathy. Moderate-sized hiatal hernia. Visualized portions of the upper abdomen demonstrates a 3.6 x 2.6 cm low-attenuation lesion in segment 2 of the liver, compatible with a simple cyst. There are no aggressive appearing lytic  or blastic lesions noted in the visualized portions of the skeleton.   IMPRESSION: 1.  Aortic Atherosclerosis (ICD10-I70.0). 2. Moderate-sized hiatal hernia.   Electronically Signed: By: Vinnie Langton M.D. On: 01/28/2021 11:14     Recent Labs: 11/05/2020: Hemoglobin 13.3; Platelets 240 01/21/2021: BUN 12; Creatinine, Ser 0.83; Magnesium 2.1; Potassium 4.2; Sodium 140  Recent Lipid Panel No results found for: CHOL, TRIG, HDL, CHOLHDL, VLDL, LDLCALC, LDLDIRECT  Physical Exam:    VS:  BP 140/70 (BP Location: Left Arm, Patient Position: Sitting)    Pulse 61    Ht 5\' 2"  (1.575 m)    Wt 199 lb 3.2 oz (90.4 kg)    SpO2 95%    BMI 36.43 kg/m  Wt Readings from Last 3 Encounters:  05/22/21 199 lb 3.2 oz (90.4 kg)  01/17/21 200 lb 3.2 oz (90.8 kg)  11/05/20 200 lb (90.7 kg)     GEN: Well nourished, well developed in no acute distress HEENT: Normal NECK: No JVD; No carotid bruits LYMPHATICS: No lymphadenopathy CARDIAC: S1S2 noted,RRR, no murmurs, rubs, gallops RESPIRATORY:  Clear to auscultation without rales, wheezing or rhonchi  ABDOMEN: Soft, non-tender, non-distended, +bowel sounds, no guarding. EXTREMITIES: No edema, No cyanosis, no clubbing MUSCULOSKELETAL:  No deformity  SKIN: Warm and dry NEUROLOGIC:  Alert and oriented x 3, non-focal PSYCHIATRIC:  Normal affect, good insight  ASSESSMENT:    1. Mild CAD   2. Liver cyst   3. Hyperlipidemia, unspecified hyperlipidemia type   4. Hiatal hernia   5. Aortic atherosclerosis (HCC)   6. Obesity (BMI 30-39.9)    PLAN:     Mild CAD-clinically no angina.  She has been started on aspirin 81 mg today.  She has statin intolerance.  I am going to have the patient see our pharmacy colleagues for evaluation for PCSK9 inhibitors.  I have educated patient about this she is agreeable to proceed with his process.  In addition her echocardiogram show evidence of liver cyst-she has not had any abdominal ultrasound for this.  We are  going to get the patient an abdominal ultrasound and once this is done a referral to GI as well which also evaluate her for her moderate hiatal hernia.  The patient understands the need to lose weight with diet and exercise. We have discussed specific strategies for this.  The patient is in agreement with the above plan. The patient left the office in stable condition.  The patient will follow up in 12 months or sooner if needed.   Medication Adjustments/Labs and Tests Ordered: Current medicines are reviewed at length with the patient today.  Concerns regarding medicines are outlined above.  Orders Placed This Encounter  Procedures   US ABDOMEN LIMITED RUQ (LIVER/GB)   AMB Referral to [facility]   No orders of the defined types were placed in this encounter.   Patient Instructions  Medication Instructions:  Your physician recommends that you continue on your current medications as directed. Please refer to the Current Medication list given to you today.  *If you need a refill on your cardiac medications before your next appointment, please call your pharmacy*   Lab Work: None If you have labs (blood work) drawn today and your tests are completely normal, you will receive your results only by: [place] (if you have MyChart) OR A paper copy in the mail If you have any lab test that is abnormal or we need to change your treatment, we will call you to review the results.   Testing/Procedures: Ultrasound of right upper   Follow-Up: At [facility], you and your health needs are our priority.  As part of our continuing mission to provide you with exceptional heart care, we have created designated Provider Care Teams.  These Care Teams include your primary Cardiologist (physician) and Advanced Practice Providers (APPs -  Physician Assistants and Nurse Practitioners) who all work together to provide you with the care you need, when you need it.  We recommend signing up  for the patient portal called "MyChart".  Sign up information is provided on this After Visit Summary.  MyChart is used to connect with patients for Virtual Visits (Telemedicine).  Patients are able to view lab/test results, encounter notes, upcoming  appointments, etc.  Non-urgent messages can be sent to your provider as well.   To learn more about what you can do with MyChart, go to [email or web address].    Your next appointment:   12 month(s)  The format for your next appointment:   In Person  Provider:   Berniece Salines, DO     Other Instructions     Adopting a Healthy Lifestyle.  Know what a healthy weight is for you (roughly BMI <25) and aim to maintain this   Aim for 7+ servings of fruits and vegetables daily   65-80+ fluid ounces of water or unsweet tea for healthy kidneys   Limit to max 1 drink of alcohol per day; avoid smoking/tobacco   Limit animal fats in diet for cholesterol and heart health - choose grass fed whenever available   Avoid highly processed foods, and foods high in saturated/trans fats   Aim for low stress - take time to unwind and care for your mental health   Aim for 150 min of moderate intensity exercise weekly for heart health, and weights twice weekly for bone health   Aim for 7-9 hours of sleep daily   When it comes to diets, agreement about the perfect plan isnt easy to find, even among the experts. Experts at the [place] developed an idea known as the Healthy Eating Plate. Just imagine a plate divided into logical, healthy portions.   The emphasis is on diet quality:   Load up on vegetables and fruits - one-half of your plate: Aim for color and variety, and remember that potatoes dont count.   Go for whole grains - one-quarter of your plate: Whole wheat, barley, wheat berries, quinoa, oats, brown rice, and foods made with them. If you want pasta, go with whole wheat pasta.   Protein power - one-quarter of your  plate: Fish, chicken, beans, and nuts are all healthy, versatile protein sources. Limit red meat.   The diet, however, does go beyond the plate, offering a few other suggestions.   Use healthy plant oils, such as olive, canola, soy, corn, sunflower and peanut. Check the labels, and avoid partially hydrogenated oil, which have unhealthy trans fats.   If youre thirsty, drink water. Coffee and tea are good in moderation, but skip sugary drinks and limit milk and dairy products to one or two daily servings.   The type of carbohydrate in the diet is more important than the amount. Some sources of carbohydrates, such as vegetables, fruits, whole grains, and beans-are healthier than others.   Finally, stay active  Rolly Pancake, DO  05/22/2021 4:54 PM    [facility]

## 2021-05-22 NOTE — Patient Instructions (Signed)
Medication Instructions:  Your physician recommends that you continue on your current medications as directed. Please refer to the Current Medication list given to you today.  *If you need a refill on your cardiac medications before your next appointment, please call your pharmacy*   Lab Work: None If you have labs (blood work) drawn today and your tests are completely normal, you will receive your results only by: [place] (if you have MyChart) OR A paper copy in the mail If you have any lab test that is abnormal or we need to change your treatment, we will call you to review the results.   Testing/Procedures: Ultrasound of right upper   Follow-Up: At [facility], you and your health needs are our priority.  As part of our continuing mission to provide you with exceptional heart care, we have created designated Provider Care Teams.  These Care Teams include your primary Cardiologist (physician) and Advanced Practice Providers (APPs -  Physician Assistants and Nurse Practitioners) who all work together to provide you with the care you need, when you need it.  We recommend signing up for the patient portal called "MyChart".  Sign up information is provided on this After Visit Summary.  MyChart is used to connect with patients for Virtual Visits (Telemedicine).  Patients are able to view lab/test results, encounter notes, upcoming appointments, etc.  Non-urgent messages can be sent to your provider as well.   To learn more about what you can do with MyChart, go to [email or web address].    Your next appointment:   12 month(s)  The format for your next appointment:   In Person  Provider:   Berniece Salines, DO     Other Instructions

## 2021-05-28 ENCOUNTER — Other Ambulatory Visit: Payer: Self-pay

## 2021-05-28 ENCOUNTER — Encounter: Payer: Self-pay | Admitting: Internal Medicine

## 2021-05-28 ENCOUNTER — Encounter (HOSPITAL_BASED_OUTPATIENT_CLINIC_OR_DEPARTMENT_OTHER): Payer: Self-pay | Admitting: Internal Medicine

## 2021-05-28 ENCOUNTER — Ambulatory Visit (HOSPITAL_BASED_OUTPATIENT_CLINIC_OR_DEPARTMENT_OTHER): Payer: PPO | Admitting: Internal Medicine

## 2021-05-28 VITALS — BP 125/64 | HR 60 | Ht 62.0 in | Wt 198.4 lb

## 2021-05-28 DIAGNOSIS — Z79899 Other long term (current) drug therapy: Secondary | ICD-10-CM | POA: Diagnosis not present

## 2021-05-28 DIAGNOSIS — R931 Abnormal findings on diagnostic imaging of heart and coronary circulation: Secondary | ICD-10-CM

## 2021-05-28 DIAGNOSIS — E785 Hyperlipidemia, unspecified: Secondary | ICD-10-CM

## 2021-05-28 DIAGNOSIS — T466X5A Adverse effect of antihyperlipidemic and antiarteriosclerotic drugs, initial encounter: Secondary | ICD-10-CM

## 2021-05-28 DIAGNOSIS — M791 Myalgia, unspecified site: Secondary | ICD-10-CM | POA: Diagnosis not present

## 2021-05-28 MED ORDER — REPATHA SURECLICK 140 MG/ML ~~LOC~~ SOAJ: 1.0000 | SUBCUTANEOUS | 11 refills | Status: DC

## 2021-05-28 NOTE — Patient Instructions (Addendum)
Medication Instructions:  Dr. Debara Pickett recommends Repatha (PCSK9). This is an injectable cholesterol medication self-administered once every 14 days. This medication will likely need prior approval with your insurance company, which we will work on. If the medication is not approved initially, we may need to do an appeal with your insurance.   Administer medication in area of fatty tissue such as abdomen, outer thigh, back of upper arm - and rotate site with each injection Store medication in refrigerator until ready to administer - allow to sit at room temp for 30 mins - 1 hour prior to injection Dispose of medication in a SHARPS container - your pharmacy should be able to direct you on this and proper disposal   If you need a co-pay card for Repatha: http://aguilar-[email or web address] >> paying for Repatha or red box that says "[place]" in top right If you need a co-pay card for Praluent: WedMap.it >> starting & paying for Praluent  Patient Assistance:   The [employer] Foundation: https://www.panfoundation.org/disease-funds/hypercholesterolemia/ -- can sign up for wait list  The [employer] foundation offers assistance to help pay for medication copays.  They will cover copays for all cholesterol lowering meds, including statins, fibrates, omega-3 fish oils like Vascepa, ezetimibe, Repatha, Praluent, Nexletol, Nexlizet.  The cards are usually good for $2,500 or 12 months, whichever comes first. Go to healthwellfoundation.org Click on Apply Now Answer questions as to whom is applying (patient or representative) Your disease fund will be hypercholesterolemia - Medicare access They will ask questions about finances and which medications you are taking for cholesterol When you submit, the approval is usually within minutes.  You will need to print the card information from the site You will need to show this information to your pharmacy, they will bill your Medicare Part D plan first -then bill Health Well --for  the copay.   You can also call them at [phone], although the hold times can be quite long.     *If you need a refill on your cardiac medications before your next appointment, please call your pharmacy*   Lab Work: Your provider has recommended lab work in 3-4 months (Lipid NMR, LPa). Please have this collected at [facility] [place]. The lab is open 8:00 am - 4:30 pm. Please avoid 12:00p - 1:00p for lunch hour. You do not need an appointment. Please go to [address] [place], [place] [address]. This is in the Primary Care office on the 3rd floor, let them know you are there for blood work and they will direct you to the lab.  If you have labs (blood work) drawn today and your tests are completely normal, you will receive your results only by: [place] (if you have MyChart) OR A paper copy in the mail If you have any lab test that is abnormal or we need to change your treatment, we will call you to review the results.   Testing/Procedures: None ordered today   Follow-Up: At [facility], you and your health needs are our priority.  As part of our continuing mission to provide you with exceptional heart care, we have created designated Provider Care Teams.  These Care Teams include your primary Cardiologist (physician) and Advanced Practice Providers (APPs -  Physician Assistants and Nurse Practitioners) who all work together to provide you with the care you need, when you need it.  We recommend signing up for the patient portal called "MyChart".  Sign up information is provided on this After  Visit Summary.  MyChart is used to connect with patients for Virtual Visits (Telemedicine).  Patients are able to view lab/test results, encounter notes, upcoming appointments, etc.  Non-urgent messages can be sent to your provider as well.   To learn more about what you can do with MyChart, go to [email or web address].    Your next appointment:   4-6  month(s)  The format for your next appointment:   In Person  Provider:   K. Mali Hilty, MD

## 2021-05-28 NOTE — Progress Notes (Signed)
LIPID CLINIC CONSULT NOTE  Chief Complaint:  Manage dyslipidemia  Primary Care Physician: Robyne Peers, MD  Primary Cardiologist:  Berniece Salines, DO  HPI:  Christy Jenkins is a 71 y.o. female who is being seen today for the evaluation of dyslipidemia at the request of Robyne Peers, MD. this is a pleasant 71 year old female kindly referred by Dr. Harriet Masson for evaluation management of dyslipidemia.  Recently she had been having some symptoms concerning for angina and underwent N[place] CT angiography, which demonstrated extensive coronary calcium with a calcium score of 939, 97th percentile for age and sex matched controls.  FFR fortunately was negative however aggressive cardiovascular risk reduction was recommended.  Unfortunately, she cannot tolerate statins.  In the past she has tried "almost every statin".  This was met with severe muscle aches which occurred within days of starting the medication and seem to resolve after stopping it.  Subsequently she was placed on ezetimibe however her lipids remain significantly elevated.  Recent total cholesterol was 236, triglycerides 90 HDL 87 LDL 131.  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  Extensive family history of coronary disease  SOCHx:   reports that she has quit smoking. Her smoking use included cigarettes. She does not have any smokeless tobacco history on file. She reports that she does not currently use alcohol. No history on file for drug use.  ALLERGIES:  Allergies  Allergen Reactions   Other Swelling, Cough, Itching, Shortness Of Breath and Tinitus   Statins    Tape      ROS: Pertinent items noted in HPI and remainder of comprehensive ROS otherwise negative.  HOME MEDS: Current Outpatient Medications on File Prior to Visit  Medication Sig Dispense Refill   albuterol (VENTOLIN HFA) 108 (90 Base) MCG/ACT inhaler Inhale into the lungs.     ALPRAZolam (XANAX) 0.25 MG tablet Take 0.25 mg by mouth 3 (three) times daily as needed.     aspirin EC 81 MG tablet Take 1 tablet (81 mg total) by mouth daily. Swallow whole. 90 tablet 3   Calcium Citrate-Vitamin D 315-5 MG-MCG TABS Take 2 tablets by mouth at bedtime.     cetirizine (ZYRTEC) 10 MG tablet Take by mouth.     Cholecalciferol 25 MCG (1000 UT) tablet Take by mouth.     escitalopram (LEXAPRO) 20 MG tablet Take 20 mg by mouth daily.     ezetimibe (ZETIA) 10 MG tablet Take by mouth.     gabapentin (NEURONTIN) 800 MG tablet Take by mouth.     latanoprost (XALATAN) 0.005 % ophthalmic solution      losartan (COZAAR) 100 MG tablet Take 1 tablet by mouth daily.     Magnesium 250 MG TABS      methocarbamol (ROBAXIN) 500 MG tablet Take 500 mg by mouth as needed.     montelukast (SINGULAIR) 10 MG tablet Take by mouth.     Multiple Vitamin (MULTI-VITAMIN) tablet Take by mouth.     naproxen (NAPROSYN) 500 MG tablet Take by mouth  as needed.     pantoprazole (PROTONIX) 40 MG tablet Take 1 tablet by mouth 2 (two) times daily.     primidone (MYSOLINE) 50 MG tablet TAKE 1 TO 2 TABLETS BY MOUTH NIGHTLY     tiZANidine (ZANAFLEX) 2 MG tablet Take 1-2 tabs twice daily as needed for pain, insomnia.     vitamin B-12 (CYANOCOBALAMIN) 500 MCG tablet      zonisamide (ZONEGRAN) 100 MG capsule at bedtime as needed.     No current facility-administered medications on file prior to visit.    LABS/IMAGING: No results found for this or any previous visit (from the past 48 hour(s)). No results found.  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   [name] has had extensive coronary artery calcification which is age advanced at 56 97th percentile.  She cannot tolerate statins and is on ezetimibe however remains above her target LDL less than 70.  She is a good candidate for PCSK9 inhibitor.  We discussed that in I demonstrated the use of the Repatha injector today.  We will pursue prior authorization and plan repeat lipid NMR and LP(a) as there is a suspicious genetic component to her cholesterol.  Her LDL is not as high as I would suspect with a familial LDL disorder, however there are certainly genetic components to her extensive coronary disease.  Thanks as always for the kind referral.  Plan follow-up with me in about 3 to 4 months  Pixie Casino, MD, [facility], [place] Director of the [facility] Lipid Disorders &  Cardiovascular Risk Reduction Clinic Diplomate of the American Board of Clinical Lipidology Attending Cardiologist  Direct Dial: [phone]   Fax: [phone]  Website:  [email or web address] 05/28/2021, 1:15 PM

## 2021-05-29 ENCOUNTER — Telehealth: Payer: Self-pay | Admitting: Internal Medicine

## 2021-05-29 NOTE — Telephone Encounter (Signed)
Called pharmacy to check on status of Repatha  ?No PA needed, copay is $45 ?Patient has already picked this up  ?

## 2021-05-30 ENCOUNTER — Encounter: Payer: Self-pay | Admitting: Cardiology

## 2021-06-03 ENCOUNTER — Ambulatory Visit (HOSPITAL_COMMUNITY)
Admission: RE | Admit: 2021-06-03 | Discharge: 2021-06-03 | Disposition: A | Discharge status: Home / Self Care | Payer: PPO | Source: Ambulatory Visit | Attending: Cardiology | Admitting: Cardiology

## 2021-06-03 ENCOUNTER — Other Ambulatory Visit: Payer: Self-pay

## 2021-06-03 ENCOUNTER — Other Ambulatory Visit (HOSPITAL_COMMUNITY): Payer: PPO

## 2021-06-03 DIAGNOSIS — K7689 Other specified diseases of liver: Secondary | ICD-10-CM | POA: Insufficient documentation

## 2021-06-06 ENCOUNTER — Encounter: Payer: Self-pay | Admitting: Cardiology

## 2021-06-17 ENCOUNTER — Other Ambulatory Visit: Payer: Self-pay

## 2021-06-17 DIAGNOSIS — K7689 Other specified diseases of liver: Secondary | ICD-10-CM

## 2021-06-24 ENCOUNTER — Ambulatory Visit: Payer: PPO

## 2021-07-03 ENCOUNTER — Telehealth: Payer: Self-pay | Admitting: Internal Medicine

## 2021-07-03 NOTE — Telephone Encounter (Signed)
Received fax from [facility] to complete PA for Repatha [employer] for HTA plan ?Faxed to [phone] ?

## 2021-07-04 NOTE — Telephone Encounter (Signed)
PA approved until 01/02/2022 ?

## 2021-07-22 ENCOUNTER — Ambulatory Visit: Payer: PPO | Admitting: Gastroenterology

## 2021-07-22 ENCOUNTER — Encounter: Payer: Self-pay | Admitting: Gastroenterology

## 2021-07-22 VITALS — BP 138/80 | HR 61 | Ht 63.0 in | Wt 205.5 lb

## 2021-07-22 DIAGNOSIS — K219 Gastro-esophageal reflux disease without esophagitis: Secondary | ICD-10-CM

## 2021-07-22 DIAGNOSIS — K449 Diaphragmatic hernia without obstruction or gangrene: Secondary | ICD-10-CM

## 2021-07-22 DIAGNOSIS — Z86010 Personal history of colonic polyps: Secondary | ICD-10-CM

## 2021-07-22 DIAGNOSIS — K7689 Other specified diseases of liver: Secondary | ICD-10-CM | POA: Diagnosis not present

## 2021-07-22 DIAGNOSIS — R1319 Other dysphagia: Secondary | ICD-10-CM | POA: Diagnosis not present

## 2021-07-22 NOTE — Patient Instructions (Addendum)
If you are age 70 or older, your body mass index should be between 23-30. Your There is no height or weight on file to calculate BMI. If this is out of the aforementioned range listed, please consider follow up with your Primary Care Provider.  ? ?__________________________________________________________ ? ?The [facility] providers would like to encourage you to use [facility] to communicate with providers for non-urgent requests or questions.  Due to long hold times on the telephone, sending your provider a message by [facility] may be a faster and more efficient way to get a response.  Please allow 48 business hours for a response.  Please remember that this is for non-urgent requests.  ? ?Due to recent changes in healthcare laws, you may see the results of your imaging and laboratory studies on MyChart before your provider has had a chance to review them.  We understand that in some cases there may be results that are confusing or concerning to you. Not all laboratory results come back in the same time frame and the provider may be waiting for multiple results in order to interpret others.  Please give [place] 48 hours in order for your provider to thoroughly review all the results before contacting the office for clarification of your results.   ? ?You have been scheduled for an endoscopy. Please follow written instructions given to you at your visit today. ?If you use inhalers (even only as needed), please bring them with you on the day of your procedure.  ? ?You have been scheduled for a Barium Esophogram at [facility] (1st floor of the hospital) on 07/30/21 at 11:00 am. Please arrive 15 minutes prior to your appointment for registration. Make certain not to have anything to eat or drink 3 hours prior to your test. If you need to reschedule for any reason, please contact radiology at [phone] to do so. ?__________________________________________________________________ ?A barium swallow is an examination  that concentrates on views of the esophagus. This tends to be a double contrast exam (barium and two liquids which, when combined, create a gas to distend the wall of the oesophagus) or single contrast (non-ionic iodine based). The study is usually tailored to your symptoms so a good history is essential. Attention is paid during the study to the form, structure and configuration of the esophagus, looking for functional disorders (such as aspiration, dysphagia, achalasia, motility and reflux) ?EXAMINATION ?You may be asked to change into a gown, depending on the type of swallow being performed. A radiologist and radiographer will perform the procedure. The radiologist will advise you of the ?type of contrast selected for your procedure and direct you during the exam. You will be asked to stand, sit or lie in several different positions and to hold a small amount of fluid in your mouth before being asked to swallow while the imaging is performed .In some instances you may be asked to swallow barium coated marshmallows to assess the motility of a solid food bolus. ?The exam can be recorded as a digital or video fluoroscopy procedure. ?POST PROCEDURE ?It will take 1-2 days for the barium to pass through your system. To facilitate this, it is important, unless otherwise directed, to increase your fluids for the next 24-48hrs and to resume your normal diet.  ?This test typically takes about 30 minutes to perform. ?__________________________________________________________________________________  ? ?Thank you for choosing me and [facility]. ? ?Gerrit Heck, D.O.  ? ?We want to thank you for trusting [facility] with  your care. All of our staff and providers value the relationships we have built with our patients, and it is an honor to care for you.  ? ?We are writing to let you know that [facility] [place] will close on Aug 12, 2021, and we invite you to continue  to see Dr. Carmell Austria and Gerrit Heck at the [facility] [place] office location. We are consolidating our serices at these [facility] practices to better provide care. Our office staff will work with you to ensure a seamless transition.  ? ?Gerrit Heck, DO -Dr. Bryan Lemma will be movig to [facility] at [address] [place], [place] [address], effective Aug 12, 2021.  Contact [phone] to schedule an appointment with him.  ? ?Carmell Austria, MD- Dr. Lyndel Safe will be movig to [facility] at [address] [place], [place] [address], effective Aug 12, 2021.  Contact [phone] to schedule an appointment with him.  ? ?Requesting Medical Records ?If you need to request your medical records, please follow the instructions below. Your medical records are confidential, and a copy can be transferred to another provider or released to you or another person you designate only with your permission. ? ?There are several ways to request your medical records: ?Requests for medical records can be submitted through our practice.   ?You can also request your records electronically, in your MyChart account by selecting the ?Request Health Records? tab.  ?If you need additional information on how to request records, please go to [email or web address] choose Patient Information, then select Request Medical Records. ?To make an appointment or if you have any questions about your health care needs, please contact our office at [phone] and one of our staff members will be glad to assist you. ?[facility] is committed to providing exceptional care for you and our community. Thank you for allowing [place] to serve your health care needs. ?Sincerely, ? ?Windy Canny, Director [facility] ?[facility] also offers convenient virtual care options. Sore throat? Sinus problems? Cold or flu symptoms? Get care from the comfort of home with [facility] Video Visits and e-Visits. Learn  more about the non-emergency conditions treated and start your virtual visit at [email or web address] ? ? ?

## 2021-07-22 NOTE — Progress Notes (Signed)
? ? ?          ?Chief Complaint: Hepatic cyst, GERD ? ? ?Referring Provider:     Silvio Pate, DO ? ? ? ?HPI:   ? ? ?Christy Jenkins is a 71 y.o. female with a history of HTN, dyslipidemia, history of CVA, GERD, cholecystectomy, anxiety, depression, tremor, referred to the Gastroenterology Clinic for evaluation of hepatic cyst incidentally noted on recent imaging. ? ? ?-05/04/2017: CT abdomen/pelvis: Liver appears slightly decreased in attenuation.  Low-attenuation lesions in the dome of the liver measuring up to 2.5 cm (likely cysts).  No duct dilatation ?- 01/28/2021: CT coronary: Moderate size hiatal hernia.  3.6 x 2.6 simple hepatic cyst ?- 06/03/2021: 3.8 x 3.2 x 2.6 cm lobulated cyst in left lobe of the liver (measuring 3.2 cm maximum diameter on CT in 05/2017) ? ?Normal liver enzymes in 03/2021. No personal hx of liver disease. No abdominal pain, jaundice, icteric sclera, ascites.  ? ?Separately, she does have a history of GERD.  Taking Protonix 40 mg BID, but still has breakthrough for which she takes Tums on demand.  Index symptoms of heartburn, regurgitation.  More recently also with episodic solid food dysphagia.  No history of food impactions.  Symptoms worse with tomato-based sauces, oranges, ice tea.  Avoid eating within 2-3 hours of bedtime and sleeps with HOB elevated. ? ? ?Previously followed at [place], last seen 01/21/2019 for ongoing evaluation/treatment of GERD.  Increased pantoprazole to 40 mg bid and continued Zantac 150 mg qhs.  Last EGD was 01/2019 and notable for hiatal hernia and fundic gland polyps. ? ? ?Family history notable for mother and brother with colon polyps. Sister with PBC.  ? ? ?Endoscopic History: ?- EGD (3/14): 5 cm hiatal hernia ?- Colonoscopy (05/2012): Normal colon with biopsies negative for microscopic colitis.  Grade 1 internal hemorrhoids, normal TI.  Recommended repeat in 5 years due to family history of colon polyps ?- EGD (01/2019; report viewed on patient's phone via  her MyChart): Medium HH, mild gastritis, 6 pedunculated fundic gland polyps ranging 5-10 mm ?- Colonoscopy (01/2019;  report viewed on patient's phone via her MyChart): Transverse polyps 5-6 mm (HP), 4 mm rectal polyp (TA), IH. Repeat 5 years ? ? ?  Latest Ref Rng & Units 01/21/2021  ? 11:12 AM 11/05/2020  ? 12:24 PM  ?CMP  ?Glucose 70 - 99 mg/dL 79   104    ?BUN 8 - 27 mg/dL 12   11    ?Creatinine 0.57 - 1.00 mg/dL 0.83   0.71    ?Sodium 134 - 144 mmol/L 140   139    ?Potassium 3.5 - 5.2 mmol/L 4.2   4.3    ?Chloride 96 - 106 mmol/L 104   101    ?CO2 20 - 29 mmol/L 25   29    ?Calcium 8.7 - 10.3 mg/dL 9.1   8.9    ? ? ? ?Past Medical History:  ?Diagnosis Date  ? Anxiety and depression   ? Asthma   ? 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 Hypertension   ? Stroke [facility])   ? Tremors of nervous system   ? ? ? ?Past Surgical History:  ?Procedure Laterality Date  ? ABDOMINAL HYSTERECTOMY    ? CHOLECYSTECTOMY    ? HERNIA REPAIR    ? TONSILLECTOMY    ? ?No family history on file. ?Social History  ? ?Tobacco Use  ? Smoking status: Former  ?  Types: Cigarettes  ?Substance Use Topics  ? Alcohol use: Not Currently  ? ?Current Outpatient Medications  ?Medication Sig Dispense Refill  ? albuterol (VENTOLIN HFA) 108 (90 Base) MCG/ACT inhaler Inhale into the lungs.    ? ALPRAZolam (XANAX) 0.25 MG tablet Take 0.25 mg by mouth 3 (three) times daily as needed.    ? aspirin EC 81 MG tablet Take 1 tablet (81 mg total) by mouth daily. Swallow whole. 90 tablet 3  ? Calcium Citrate-Vitamin D 315-5 MG-MCG TABS Take 2 tablets by mouth at bedtime.    ? cetirizine (ZYRTEC) 10 MG tablet Take by mouth.    ? Cholecalciferol 25 MCG (1000 UT) tablet Take by mouth.    ? escitalopram (LEXAPRO) 20 MG tablet Take 20 mg by mouth daily.    ? Evolocumab (REPATHA SURECLICK) 833 MG/ML SOAJ Inject 1 Dose into the skin every 14 (fourteen) days. 2 mL 11  ? ezetimibe (ZETIA) 10 MG tablet Take by mouth.    ? gabapentin (NEURONTIN) 800 MG tablet  Take by mouth.    ? latanoprost (XALATAN) 0.005 % ophthalmic solution     ? losartan (COZAAR) 100 MG tablet Take 1 tablet by mouth daily.    ? Magnesium 250 MG TABS     ? methocarbamol (ROBAXIN) 500 MG tablet Take 500 mg by mouth as needed.    ? montelukast (SINGULAIR) 10 MG tablet Take by mouth.    ? Multiple Vitamin (MULTI-VITAMIN) tablet Take by mouth.    ? naproxen (NAPROSYN) 500 MG tablet Take by mouth as needed.    ? pantoprazole (PROTONIX) 40 MG tablet Take 1 tablet by mouth 2 (two) times daily.    ? primidone (MYSOLINE) 50 MG tablet TAKE 1 TO 2 TABLETS BY MOUTH NIGHTLY    ? tiZANidine (ZANAFLEX) 2 MG tablet Take 1-2 tabs twice daily as needed for pain, insomnia.    ? vitamin B-12 (CYANOCOBALAMIN) 500 MCG tablet     ? zonisamide (ZONEGRAN) 100 MG capsule at bedtime as needed.    ? ?No current facility-administered medications for this visit.  ? ?Allergies  ?Allergen Reactions  ? Other Swelling, Cough, Itching, Shortness Of Breath and Tinitus  ? Statins   ? Tape   ? ? ? ?Review of Systems: ?All systems reviewed and negative except where noted in HPI.  ? ? ? ?Physical Exam:   ? ?Wt Readings from Last 3 Encounters:  ?07/22/21 205 lb 8 oz (93.2 kg)  ?05/28/21 198 lb 6.4 oz (90 kg)  ?05/22/21 199 lb 3.2 oz (90.4 kg)  ? ? ?BP 138/80 (BP Location: Left Arm, Patient Position: Sitting, Cuff Size: Normal)   Pulse 61   Ht '5\' 3"'$  (1.6 m)   Wt 205 lb 8 oz (93.2 kg)   SpO2 98%   BMI 36.40 kg/m?  ?Constitutional:  Pleasant, in no acute distress. ?Psychiatric: Normal mood and affect. Behavior is normal. ?Abdominal: Soft, nondistended, nontender.  ?Neurological: Alert and oriented to person place and time. ?Skin: Skin is warm and dry. No rashes noted. ? ? ?ASSESSMENT AND PLAN;  ? ?1) Hepatic cyst ?Plan recent imaging study with incidentally noted simple hepatic cyst.  This is overall stable in size between CT in 2019, CT 12/2020, and most recent RUQ [place] in 05/2021.  Discussed role/utility of further imaging to include MRI  Liver protocol in 6-12 months.  Based on overall size stability and benign appearance, she opted for observation alone. ? ?2) GERD ?3) Hiatal hernia ?4) Regurgitation ?5) Dysphagia ?  Suspect reflux symptoms suboptimally controlled despite high-dose PPI due to large hiatal hernia and significant LES laxity. ?- Barium esophagram with tablet to evaluate size of hernia and severity of refluxate ?- EGD to evaluate for erosive esophagitis, measure hernia, and preoperative assessment for possible laparoscopic hiatal hernia pair antireflux surgery ?- Continue high-dose PPI for now ?- Okay to use Tums on demand ?- Continue antireflux lifestyle/dietary modifications with avoidance of exacerbating foods ? ?6) History of colon polyps ?- Due for repeat colonoscopy in 2025 for ongoing polyp surveillance ? ?The indications, risks, and benefits of EGD were explained to the patient in detail. Risks include but are not limited to bleeding, perforation, adverse reaction to medications, and cardiopulmonary compromise. Sequelae include but are not limited to the possibility of surgery, hospitalization, and mortality. The patient verbalized understanding and wished to proceed. All questions answered, referred to scheduler. Further recommendations pending results of the exam.  ? ? ?Lavena Bullion, DO, FACG  07/22/2021, 9:01 AM ? ? ?Robyne Peers, MD ? ?

## 2021-07-29 ENCOUNTER — Telehealth: Payer: PPO | Admitting: Internal Medicine

## 2021-07-30 ENCOUNTER — Other Ambulatory Visit: Payer: Self-pay

## 2021-07-30 ENCOUNTER — Telehealth: Payer: Self-pay | Admitting: Gastroenterology

## 2021-07-30 ENCOUNTER — Ambulatory Visit (HOSPITAL_COMMUNITY)
Admission: RE | Admit: 2021-07-30 | Discharge: 2021-07-30 | Disposition: A | Discharge status: Home / Self Care | Payer: PPO | Source: Ambulatory Visit | Attending: Gastroenterology | Admitting: Gastroenterology

## 2021-07-30 DIAGNOSIS — K219 Gastro-esophageal reflux disease without esophagitis: Secondary | ICD-10-CM | POA: Diagnosis not present

## 2021-07-30 DIAGNOSIS — K7689 Other specified diseases of liver: Secondary | ICD-10-CM | POA: Diagnosis present

## 2021-07-30 DIAGNOSIS — R1319 Other dysphagia: Secondary | ICD-10-CM

## 2021-07-30 DIAGNOSIS — K449 Diaphragmatic hernia without obstruction or gangrene: Secondary | ICD-10-CM | POA: Diagnosis present

## 2021-07-30 DIAGNOSIS — K224 Dyskinesia of esophagus: Secondary | ICD-10-CM

## 2021-07-30 NOTE — Telephone Encounter (Signed)
Patient returned your call, please advise. 

## 2021-07-30 NOTE — Telephone Encounter (Signed)
Spoke with pt. See 07/30/21 barium swallow result note ?

## 2021-07-31 ENCOUNTER — Other Ambulatory Visit: Payer: Self-pay

## 2021-07-31 DIAGNOSIS — R1319 Other dysphagia: Secondary | ICD-10-CM

## 2021-07-31 DIAGNOSIS — K224 Dyskinesia of esophagus: Secondary | ICD-10-CM

## 2021-08-22 ENCOUNTER — Encounter: Payer: Self-pay | Admitting: Gastroenterology

## 2021-08-22 ENCOUNTER — Ambulatory Visit (AMBULATORY_SURGERY_CENTER): Payer: PPO | Admitting: Gastroenterology

## 2021-08-22 VITALS — BP 111/45 | HR 67 | Temp 96.2°F | Resp 11 | Ht 63.0 in | Wt 205.0 lb

## 2021-08-22 DIAGNOSIS — D131 Benign neoplasm of stomach: Secondary | ICD-10-CM

## 2021-08-22 DIAGNOSIS — K449 Diaphragmatic hernia without obstruction or gangrene: Secondary | ICD-10-CM | POA: Diagnosis not present

## 2021-08-22 DIAGNOSIS — R1319 Other dysphagia: Secondary | ICD-10-CM | POA: Diagnosis not present

## 2021-08-22 DIAGNOSIS — K2100 Gastro-esophageal reflux disease with esophagitis, without bleeding: Secondary | ICD-10-CM

## 2021-08-22 DIAGNOSIS — K2289 Other specified disease of esophagus: Secondary | ICD-10-CM

## 2021-08-22 DIAGNOSIS — K317 Polyp of stomach and duodenum: Secondary | ICD-10-CM | POA: Diagnosis not present

## 2021-08-22 MED ORDER — SODIUM CHLORIDE 0.9 % IV SOLN
500.0000 mL | Freq: Once | INTRAVENOUS | Status: DC
Start: 2021-08-22 — End: 2021-08-22

## 2021-08-22 NOTE — Progress Notes (Signed)
Called to room to assist during endoscopic procedure.  Patient ID and intended procedure confirmed with present staff. Received instructions for my participation in the procedure from the performing physician.  

## 2021-08-22 NOTE — Progress Notes (Signed)
GASTROENTEROLOGY PROCEDURE H&P NOTE   Primary Care Physician: Robyne Peers, MD    Reason for Procedure:   GERD, heartburn, dysphagia, hiatal hernia  Plan:    EGD  Patient is appropriate for endoscopic procedure(s) in the ambulatory ([facility]) setting.  The nature of the procedure, as well as the risks, benefits, and alternatives were carefully and thoroughly reviewed with the patient. Ample time for discussion and questions allowed. The patient understood, was satisfied, and agreed to proceed.     HPI: Christy Jenkins is a 71 y.o. female who presents for EGD for evalution of GERD with breakthrough sxsw despite high dose PPI, along with evaluation/treatment of dysphagia, and eval of hiatal hernia.   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 Authorizing Provider  aspirin EC 81 MG tablet Take 1 tablet (81 mg total) by mouth daily. Swallow whole. 01/31/21  Yes Tobb, Kardie, DO  Calcium Citrate-Vitamin D 315-5 MG-MCG TABS Take 2 tablets by mouth at bedtime.   Yes [provider]  cetirizine (ZYRTEC) 10 MG tablet Take by mouth.   Yes [provider]  Cholecalciferol 25 MCG (1000 UT) tablet Take by mouth.   Yes [provider]  escitalopram (LEXAPRO) 20 MG tablet Take 20 mg by mouth daily. 01/16/21  Yes [provider]  Evolocumab (REPATHA SURECLICK) 967 MG/ML SOAJ Inject 1 Dose into the skin every 14 (fourteen) days. 05/28/21  Yes Hilty, Nadean Corwin, MD  ezetimibe (ZETIA) 10 MG tablet Take by mouth. 11/21/16  Yes [provider]  fluticasone-salmeterol (ADVAIR) 100-50  MCG/ACT AEPB Inhale 1 puff into the lungs 2 (two) times daily. 08/17/21  Yes [provider]  fluticasone-salmeterol (ADVAIR) 100-50 MCG/ACT AEPB Inhale into the lungs. 06/05/21  Yes [provider]  gabapentin (NEURONTIN) 800 MG tablet Take by mouth. 10/15/20  Yes [provider]  latanoprost (XALATAN) 0.005 % ophthalmic solution  08/12/19  Yes [provider]  losartan (COZAAR) 100 MG tablet Take 1 tablet by mouth daily. 10/15/20  Yes [provider]  Magnesium 250 MG TABS    Yes [provider]  montelukast (SINGULAIR) 10 MG tablet Take by mouth. 11/21/16  Yes [provider]  Multiple Vitamin (MULTI-VITAMIN) tablet Take by mouth. 05/17/08  Yes [provider]  pantoprazole (PROTONIX) 40 MG tablet Take 1 tablet by mouth 2 (two) times daily. 11/21/16  Yes [provider]  primidone (MYSOLINE) 50 MG tablet TAKE 1 TO 2 TABLETS BY MOUTH NIGHTLY 03/06/16  Yes [provider]  vitamin B-12 (CYANOCOBALAMIN) 500 MCG tablet  09/29/18  Yes [provider]  zonisamide (ZONEGRAN) 100 MG capsule at bedtime as needed. 09/03/20  Yes [provider]  albuterol (VENTOLIN HFA) 108 (90 Base) MCG/ACT inhaler Inhale into the lungs. 02/26/16   [provider]  ALPRAZolam Duanne Moron) 0.25 MG tablet Take 0.25 mg by mouth 3 (three) times daily as needed. 10/15/20   [provider]  methocarbamol (ROBAXIN) 500 MG tablet Take 500 mg by mouth as needed.  12/12/20   [provider]  naproxen (NAPROSYN) 500 MG tablet Take by mouth as needed. 05/17/08   [provider]  tiZANidine (ZANAFLEX) 2 MG tablet Take 1-2 tabs twice daily as needed for pain, insomnia. 09/03/20   [provider]    Current Outpatient Medications  Medication Sig Dispense Refill   aspirin EC 81 MG tablet Take 1 tablet (81 mg total) by mouth daily. Swallow whole. 90 tablet 3   Calcium Citrate-Vitamin D 315-5 MG-MCG TABS Take  2 tablets by mouth at bedtime.     cetirizine (ZYRTEC) 10 MG tablet Take by mouth.     Cholecalciferol 25 MCG (1000 UT) tablet Take by mouth.     escitalopram (LEXAPRO) 20 MG tablet Take 20 mg by mouth daily.     Evolocumab (REPATHA SURECLICK) 412 MG/ML SOAJ Inject 1 Dose into the skin every 14 (fourteen) days. 2 mL 11   ezetimibe (ZETIA) 10 MG tablet Take by mouth.     fluticasone-salmeterol (ADVAIR) 100-50 MCG/ACT AEPB Inhale 1 puff into the lungs 2 (two) times daily.     fluticasone-salmeterol (ADVAIR) 100-50 MCG/ACT AEPB Inhale into the lungs.     gabapentin (NEURONTIN) 800 MG tablet Take by mouth.     latanoprost (XALATAN) 0.005 % ophthalmic solution      losartan (COZAAR) 100 MG tablet Take 1 tablet by mouth daily.     Magnesium 250 MG TABS      montelukast (SINGULAIR) 10 MG tablet Take by mouth.     Multiple Vitamin (MULTI-VITAMIN) tablet Take by mouth.     pantoprazole (PROTONIX) 40 MG tablet Take 1 tablet by mouth 2 (two) times daily.     primidone (MYSOLINE) 50 MG tablet TAKE 1 TO 2 TABLETS BY MOUTH NIGHTLY     vitamin B-12 (CYANOCOBALAMIN) 500 MCG tablet      zonisamide (ZONEGRAN) 100 MG capsule at bedtime as needed.     albuterol (VENTOLIN HFA) 108 (90 Base) MCG/ACT inhaler Inhale into the lungs.     ALPRAZolam (XANAX) 0.25 MG tablet Take 0.25 mg by mouth 3 (three) times daily as needed.     methocarbamol (ROBAXIN) 500 MG tablet Take 500 mg by mouth as needed.     naproxen (NAPROSYN) 500 MG tablet Take by mouth as needed.     tiZANidine (ZANAFLEX) 2 MG tablet Take 1-2 tabs twice daily as needed for pain, insomnia.     Current Facility-Administered Medications  Medication Dose Route Frequency Provider Last Rate Last Admin   0.9 %  sodium chloride infusion  500 mL Intravenous Once Christos Mixson V, DO        Allergies as of 08/22/2021 - Review Complete 08/22/2021  Allergen Reaction Noted   Other Swelling, Cough, Itching, Shortness Of Breath, and Tinitus 05/22/2021    Statins  11/05/2020   Tape  11/05/2020    History reviewed. No pertinent family history.  Social History   Socioeconomic History   Marital status: Single    Spouse name: Not on file   Number of children: Not on file   Years of education: Not on file   Highest education level: Not on file  Occupational History   Not on file  Tobacco Use   Smoking status: Former    Types: Cigarettes   Smokeless tobacco: Not on file  Vaping Use   Vaping Use: Never used  Substance and Sexual Activity   Alcohol use: Not Currently   Drug use: Not on file   Sexual activity:  Not on file  Other Topics Concern   Not on file  Social History Narrative   Not on file   Social Determinants of Health   Financial Resource Strain: Not on file  Food Insecurity: Not on file  Transportation Needs: Not on file  Physical Activity: Not on file  Stress: Not on file  Social Connections: Not on file  Intimate Partner Violence: Not on file    Physical Exam: Vital signs in last 24 hours: '@BP'$  (!) 143/68   Pulse 62   Temp (!) 96.2 F (35.7 C) (Temporal)   Ht '5\' 3"'$  (1.6 m)   Wt 205 lb (93 kg)   SpO2 98%   BMI 36.31 kg/m  GEN: NAD EYE: Sclerae anicteric ENT: MMM CV: Non-tachycardic Pulm: CTA b/l GI: Soft, NT/ND NEURO:  Alert & Oriented x 3   Gerrit Heck, DO [facility]   08/22/2021 10:03 AM

## 2021-08-22 NOTE — Op Note (Signed)
[facility] Patient Name: Christy Jenkins Procedure Date: 08/22/2021 10:04 AM MRN: [id number] Endoscopist: Gerrit Heck , MD Age: 71 Referring MD:  Date of Birth: 1950-12-24 Gender: Female Account #: [id number] Procedure:                Upper GI endoscopy Indications:              Dysphagia, Heartburn, Esophageal reflux Medicines:                Monitored Anesthesia Care Procedure:                Pre-Anesthesia Assessment:                           - Prior to the procedure, a History and Physical                            was performed, and patient medications and                            allergies were reviewed. The patient's tolerance of                            previous anesthesia was also reviewed. The risks                            and benefits of the procedure and the sedation                            options and risks were discussed with the patient.                            All questions were answered, and informed consent                            was obtained. Prior Anticoagulants: The patient has                            taken no previous anticoagulant or antiplatelet                            agents. ASA Grade Assessment: III - A patient with                            severe systemic disease. After reviewing the risks                            and benefits, the patient was deemed in                            satisfactory condition to undergo the procedure.                           After obtaining informed consent, the endoscope was  passed under direct vision. Throughout the                            procedure, the patient's blood pressure, pulse, and                            oxygen saturations were monitored continuously. The                            Endoscope was introduced through the mouth, and                            advanced to the second part of duodenum. The upper                            GI  endoscopy was accomplished without difficulty.                            The patient tolerated the procedure well. Scope In: Scope Out: Findings:                 The middle third of the esophagus and lower third                            of the esophagus were moderately tortuous.                           A 4 cm hiatal hernia was present.                           LA Grade A (one or more mucosal breaks less than 5                            mm, not extending between tops of 2 mucosal folds)                            esophagitis with no bleeding was found 33 cm from                            the incisors. Estimated blood loss was minimal.                           The mucosa was otherwise normal appearing in the                            esophagus. Given symptoms of dysphagia, the                            decision was made to proceed with esophageal                            dilation. A TTS dilator was passed through the  scope. Dilation with a 16-17-18 mm balloon dilator                            was performed to 17 mm. No mucosal rent noted in                            the distal esophagus. The balloon was reinflated to                            17 mm and dragged proximally through the esophagus                            for discovery and treatment of subtle mid/proximal                            strictures. The esophagus was re-examined following                            endoscope reinsertion and showed no bleeding,                            mucosal tear or perforation. Biopsies were then                            obtained from the proximal and distal esophagus                            with cold forceps for histology of suspected                            eosinophilic esophagitis. Estimated blood loss was                            minimal.                           Multiple sessile polyps with no bleeding were found                             in the gastric fundus and in the gastric body.                            Several of these polyps were removed with a cold                            biopsy forceps for histologic representative                            evaluation. Resection and retrieval were complete.                            Estimated blood loss was minimal.  The examined duodenum was normal. Complications:            No immediate complications. Estimated Blood Loss:     Estimated blood loss was minimal. Impression:               - Tortuous esophagus.                           - 4 cm hiatal hernia.                           - LA Grade A reflux esophagitis with no bleeding.                           - Normal mucosa was found in the entire esophagus.                            Dilated with 17 mm TTS balloon then biopsied.                           - Multiple gastric polyps. Resected and retrieved.                           - Normal examined duodenum. Recommendation:           - Patient has a contact number available for                            emergencies. The signs and symptoms of potential                            delayed complications were discussed with the                            patient. Return to normal activities tomorrow.                            Written discharge instructions were provided to the                            patient.                           - Resume previous diet.                           - Continue present medications.                           - Await pathology results.                           - Repeat upper endoscopy PRN for retreatment.                           - Return to GI clinic at appointment to be  scheduled. Gerrit Heck, MD 08/22/2021 10:36:09 AM

## 2021-08-22 NOTE — Progress Notes (Signed)
VS completed by CW.   Pt's states no medical or surgical changes since previsit or office visit.  

## 2021-08-22 NOTE — Patient Instructions (Signed)
Please read handouts provided. Continue present medications. Await pathology results. Repeat upper endoscopy as needed for treatment. Return to GI clinic at appointment to be scheduled.   YOU HAD AN ENDOSCOPIC PROCEDURE TODAY AT [facility]:   Refer to the procedure report that was given to you for any specific questions about what was found during the examination.  If the procedure report does not answer your questions, please call your gastroenterologist to clarify.  If you requested that your care partner not be given the details of your procedure findings, then the procedure report has been included in a sealed envelope for you to review at your convenience later.  YOU SHOULD EXPECT: Some feelings of bloating in the abdomen. Passage of more gas than usual.  Walking can help get rid of the air that was put into your GI tract during the procedure and reduce the bloating. If you had a lower endoscopy (such as a colonoscopy or flexible sigmoidoscopy) you may notice spotting of blood in your stool or on the toilet paper. If you underwent a bowel prep for your procedure, you may not have a normal bowel movement for a few days.  Please Note:  You might notice some irritation and congestion in your nose or some drainage.  This is from the oxygen used during your procedure.  There is no need for concern and it should clear up in a day or so.  SYMPTOMS TO REPORT IMMEDIATELY:  Following upper endoscopy (EGD)  Vomiting of blood or coffee ground material  New chest pain or pain under the shoulder blades  Painful or persistently difficult swallowing  New shortness of breath  Fever of 100F or higher  Black, tarry-looking stools  For urgent or emergent issues, a gastroenterologist can be reached at any hour by calling [phone]. Do not use MyChart messaging for urgent concerns.    DIET:  We do recommend a small meal at first, but then you may proceed to your regular diet.  Drink  plenty of fluids but you should avoid alcoholic beverages for 24 hours.  ACTIVITY:  You should plan to take it easy for the rest of today and you should NOT DRIVE or use heavy machinery until tomorrow (because of the sedation medicines used during the test).    FOLLOW UP: Our staff will call the number listed on your records 48-72 hours following your procedure to check on you and address any questions or concerns that you may have regarding the information given to you following your procedure. If we do not reach you, we will leave a message.  We will attempt to reach you two times.  During this call, we will ask if you have developed any symptoms of COVID 19. If you develop any symptoms (ie: fever, flu-like symptoms, shortness of breath, cough etc.) before then, please call [phone].  If you test positive for Covid 19 in the 2 weeks post procedure, please call and report this information to [place].    If any biopsies were taken you will be contacted by phone or by letter within the next 1-3 weeks.  Please call us at [phone] if you have not heard about the biopsies in 3 weeks.    SIGNATURES/CONFIDENTIALITY: You and/or your care partner have signed paperwork which will be entered into your electronic medical record.  These signatures attest to the fact that that the information above on your After Visit Summary has been reviewed and is understood.  Full responsibility  of the confidentiality of this discharge information lies with you and/or your care-partner.

## 2021-08-22 NOTE — Progress Notes (Signed)
To pacu, VSS. Report to [email or web address] 

## 2021-08-28 ENCOUNTER — Telehealth: Payer: Self-pay

## 2021-08-28 NOTE — Telephone Encounter (Signed)
Spoke with pt to schedule appt as requested on 5/25 EGD report. Attempted to schedule pt for next available on 6/29 but pt requested a sooner appt since she is still having dysphagia symptoms. Pt also stated she wanted it to be after esophageal manometry on 6/9. Pt scheduled for appt with Dr. Bryan Lemma on 6/21 at 10 am.

## 2021-08-29 ENCOUNTER — Encounter (HOSPITAL_COMMUNITY): Payer: Self-pay | Admitting: Gastroenterology

## 2021-09-04 ENCOUNTER — Encounter: Payer: Self-pay | Admitting: Gastroenterology

## 2021-09-06 ENCOUNTER — Encounter (HOSPITAL_COMMUNITY)
Admission: RE | Disposition: A | Discharge status: Home / Self Care | Payer: Self-pay | Source: Home / Self Care | Attending: Gastroenterology

## 2021-09-06 ENCOUNTER — Ambulatory Visit (HOSPITAL_COMMUNITY)
Admission: RE | Admit: 2021-09-06 | Discharge: 2021-09-06 | Disposition: A | Discharge status: Home / Self Care | Payer: PPO | Attending: Gastroenterology | Admitting: Gastroenterology

## 2021-09-06 ENCOUNTER — Encounter (HOSPITAL_COMMUNITY): Payer: Self-pay | Admitting: Gastroenterology

## 2021-09-06 DIAGNOSIS — R12 Heartburn: Secondary | ICD-10-CM | POA: Diagnosis present

## 2021-09-06 DIAGNOSIS — R1310 Dysphagia, unspecified: Secondary | ICD-10-CM | POA: Diagnosis not present

## 2021-09-06 HISTORY — PX: ESOPHAGEAL MANOMETRY: SHX5429

## 2021-09-06 SURGERY — MANOMETRY, ESOPHAGUS
Anesthesia: Choice

## 2021-09-06 MED ORDER — LIDOCAINE VISCOUS HCL 2 % MT SOLN
OROMUCOSAL | Status: AC
  Filled 2021-09-06: qty 15

## 2021-09-06 SURGICAL SUPPLY — 2 items
FACESHIELD LNG OPTICON STERILE (SAFETY) IMPLANT
GLOVE BIO SURGEON STRL SZ8 (GLOVE) ×4 IMPLANT

## 2021-09-06 NOTE — Progress Notes (Signed)
Esophageal Manometry done per protocol. Patient tolerated well without distress or complication.  

## 2021-09-10 DIAGNOSIS — R12 Heartburn: Secondary | ICD-10-CM

## 2021-09-10 DIAGNOSIS — R1310 Dysphagia, unspecified: Secondary | ICD-10-CM

## 2021-09-18 ENCOUNTER — Ambulatory Visit: Payer: PPO | Admitting: Gastroenterology

## 2021-09-18 ENCOUNTER — Encounter: Payer: Self-pay | Admitting: Gastroenterology

## 2021-09-18 VITALS — BP 122/70 | HR 65 | Ht 63.0 in | Wt 200.0 lb

## 2021-09-18 DIAGNOSIS — K7689 Other specified diseases of liver: Secondary | ICD-10-CM

## 2021-09-18 DIAGNOSIS — R1319 Other dysphagia: Secondary | ICD-10-CM | POA: Diagnosis not present

## 2021-09-18 DIAGNOSIS — Z86010 Personal history of colonic polyps: Secondary | ICD-10-CM

## 2021-09-18 DIAGNOSIS — K2100 Gastro-esophageal reflux disease with esophagitis, without bleeding: Secondary | ICD-10-CM | POA: Diagnosis not present

## 2021-09-18 DIAGNOSIS — K449 Diaphragmatic hernia without obstruction or gangrene: Secondary | ICD-10-CM

## 2021-09-18 NOTE — Patient Instructions (Addendum)
You will be contacted by [facility] for consult. [address], [address].  Contact [phone].   If you are age 70 or older, your body mass index should be between 23-30. Your Body mass index is 35.43 kg/m. If this is out of the aforementioned range listed, please consider follow up with your Primary Care Provider.  ________________________________________________________  The [facility] providers would like to encourage you to use [facility] to communicate with providers for non-urgent requests or questions.  Due to long hold times on the telephone, sending your provider a message by [facility] may be a faster and more efficient way to get a response.  Please allow 48 business hours for a response.  Please remember that this is for non-urgent requests.  _______________________________________________________  Due to recent changes in healthcare laws, you may see the results of your imaging and laboratory studies on MyChart before your provider has had a chance to review them.  We understand that in some cases there may be results that are confusing or concerning to you. Not all laboratory results come back in the same time frame and the provider may be waiting for multiple results in order to interpret others.  Please give [place] 48 hours in order for your provider to thoroughly review all the results before contacting the office for clarification of your results.     Thank you for choosing me and [facility].  Vito Cirigliano, D.O.

## 2021-09-18 NOTE — Progress Notes (Signed)
Chief Complaint:    Dysphagia, GERD, procedure follow-up  GI History: 71 y.o. female with a history of HTN, dyslipidemia, history of CVA, GERD, cholecystectomy, anxiety, depression, tremor, follows in the GI clinic for the following:  1) Hepatic cyst.  Incidentally noted on imaging for other reasons -05/04/2017: CT abdomen/pelvis: Liver appears slightly decreased in attenuation.  Low-attenuation lesions in the dome of the liver measuring up to 2.5 cm (likely cysts).  No duct dilatation - 01/28/2021: CT coronary: Moderate size hiatal hernia.  3.6 x 2.6 simple hepatic cyst - 06/03/2021: 3.8 x 3.2 x 2.6 cm lobulated cyst in left lobe of the liver (measuring 3.2 cm maximum diameter on CT in 05/2017) - 07/22/2021: Evaluated in the GI clinic.  Patient opted for observation alone  Normal liver enzymes in 03/2021. No personal hx of liver disease. No abdominal pain, jaundice, icteric sclera, ascites.  2) GERD 3) Dysphagia Longstanding history of reflux.  Index symptoms of heartburn, regurgitation.  More recently also with episodic solid food dysphagia.  No history of food impactions.  Symptoms worse with tomato-based sauces, oranges, ice tea.  Avoid eating within 2-3 hours of bedtime and sleeps with HOB elevated.  Reflux generally controlled with Protonix 40 mg bid, but still uses Tums on demand for breakthrough heartburn. - 01/21/2019: Evaluation by [facility].  Increased pantoprazole to 40 mg bid and continued Zantac 150 mg - 01/2019: EGD: Hiatal hernia, fundic gland polyps - 07/22/2021: Initial appointment in this GI clinic - 07/30/2021: Barium esophagram: Moderate size hiatal hernia, moderate to large volume gastroesophageal reflux to the level of the mid esophagus.  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 - 08/22/2021: EGD: 4 cm HH, LA Grade A esophagitis.  Esophagus dilated with 17 mm TTS balloon without mucosal rent.  Biopsies negative for EOE.  Multiple benign fundic gland  polyps. - 09/06/2021: Esophageal Manometry: Normal.  70% of swallows were peristaltic with normal latency and DCI.  30% of swallows with fragmented peristalsis.  No manometric HH.  Bolus clearance on 6/10 swallows. Rapid sequence swallows was normal   Endoscopic History: - EGD (3/14): 5 cm hiatal hernia - Colonoscopy (05/2012): Normal colon with biopsies negative for microscopic colitis.  Grade 1 internal hemorrhoids, normal TI.  Recommended repeat in 5 years due to family history of colon polyps - EGD (01/2019; report viewed on patient's phone via her MyChart): Medium HH, mild gastritis, 6 pedunculated fundic gland polyps ranging 5-10 mm - Colonoscopy (01/2019;  report viewed on patient's phone via her MyChart): Transverse polyps 5-6 mm (HP), 4 mm rectal polyp (TA), IH. Repeat 5 years - EGD (07/2021): 4 cm HH, LA Grade A esophagitis.  Empiric dilation 17 mm TTS balloon.  Esophageal biopsies negative for EOE.  Fundic gland polyps.  HPI:     Patient is a 71 y.o. female presenting to the Gastroenterology Clinic for follow-up.  Initially seen on 07/22/2021 for evaluation of GERD, dysphagia.  Has since completed barium esophagram (significant reflux), EGD (hiatal hernia, erosive esophagitis) and esophageal manometry (preserved peristalsis) as outlined above.  She continues to have breakthrough reflux despite taking her Protonix 40 mg bid as prescribed.  Did have a significant reflux episode last week with heartburn, waterbrash, coughing, and burning sensation in her sinuses which woke her from sleep.  Dysphagia largely unchanged.  Continues to cut food into small pieces, chewing thoroughly, and drinking plenty fluids with meals.    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     ALPRAZolam (XANAX) 0.25 MG tablet Take 0.25 mg by mouth 3 (three) times daily as needed.     aspirin EC 81 MG tablet Take 1 tablet (81 mg total) by mouth daily. Swallow whole. 90 tablet 3   Calcium Citrate-Vitamin D 315-5 MG-MCG TABS Take 2 tablets by mouth at bedtime.     cetirizine (ZYRTEC) 10 MG tablet Take by mouth.     Cholecalciferol 25 MCG (1000 UT) tablet Take by mouth.     escitalopram (LEXAPRO) 20 MG tablet Take 20 mg by mouth daily.     Evolocumab (REPATHA SURECLICK) 350 MG/ML SOAJ Inject 1 Dose into the skin every 14 (fourteen) days. 2 mL 11   ezetimibe (ZETIA) 10 MG tablet Take by mouth.     fluticasone-salmeterol (ADVAIR) 100-50 MCG/ACT AEPB Inhale 1 puff into the lungs 2 (two) times daily.     gabapentin (NEURONTIN) 800 MG tablet Take by mouth.     latanoprost (XALATAN) 0.005 % ophthalmic solution      losartan (COZAAR) 100 MG tablet Take 1 tablet by mouth daily.     Magnesium 250 MG TABS      methocarbamol (ROBAXIN) 500 MG tablet Take 500 mg by mouth as needed.     montelukast (SINGULAIR) 10 MG tablet Take by mouth.     Multiple Vitamin (MULTI-VITAMIN) tablet Take by mouth.     naproxen (NAPROSYN) 500 MG tablet Take by mouth as needed.     pantoprazole (PROTONIX) 40 MG tablet Take 1 tablet by mouth 2 (two) times daily.     primidone (MYSOLINE) 50 MG tablet TAKE 1 TO 2 TABLETS BY MOUTH NIGHTLY     tiZANidine (ZANAFLEX) 2 MG tablet Take 1-2 tabs twice daily as needed for pain, insomnia.     vitamin B-12 (CYANOCOBALAMIN) 500 MCG tablet      zonisamide (ZONEGRAN) 100 MG capsule at bedtime as needed.     No current facility-administered medications for this visit.    Physical Exam:     BP 122/70    Pulse 65   Ht '5\' 3"'$  (1.6 m)   Wt 200 lb (90.7 kg)   BMI 35.43 kg/m   GENERAL:  Pleasant female in NAD PSYCH: : Cooperative, normal affect Musculoskeletal:  Normal muscle tone, normal strength NEURO: Alert and oriented x 3, no focal neurologic deficits   IMPRESSION and PLAN:    1) GERD with erosive esophagitis 2) Hiatal hernia 3) Regurgitation 4) Dysphagia 71 year old female with longstanding history of GERD.  Still with breakthrough symptoms despite high-dose PPI.  Recent EGD with erosive esophagitis and 4 cm hiatal hernia.  Completed esophageal manometry which demonstrates largely preserved motility.  A few episodes of fragmented peristalsis which, in combination with regurgitation/reflux, resulted in incomplete bolus clearance in 4/10.  However, rapid sequence swallows were normal.  There is likely a component of reflux burnout which should do well with improved GERD control. Discussed with Dr. Silverio Decamp who  agrees that her esophageal motility is intact and would be reasonable to proceed with antireflux surgery.  We a long conversation today regarding her continued GERD and regurgitation.  Discussed continued medical management vs surgery, and she is very interested in the latter.  Specifically discussed concomitant laparoscopic hiatal hernia repair and Transoral Incisionless Fundoplication (cTIF) and she would very much like to proceed as a means to better control her reflux and hopefully stop or significantly reduce the need for acid suppression therapy long-term.  - Referral to Dr. Redmond Pulling for consideration of concomitant laparoscopic hiatal hernia repair and TIF - Continue Protonix - Continue antireflux lifestyle/dietary modifications - Continue cutting food into small pieces, chewing food thoroughly, and plenty of fluids with meals   5) Hepatic cyst Simple hepatic cyst with stable size on CT in 2019, CT 12/2020, and  RUQ [place] in 05/2021.  Previously discussed role/utility of further  imaging to include MRI Liver protocol in 6-12 months.  Based on overall size stability and benign appearance, she opted for observation alone.  6) History of colon polyps - Repeat colonoscopy in 2025 for ongoing polyp surveillance.  She would like to arrange to have that done here instead of her previous GI   I spent 45 minutes of time, including in depth chart review, independent review of results as outlined above, communicating results with the patient directly, face-to-face time with the patient, coordinating care, ordering studies and medications as appropriate, and documentation.             Aneta ,DO, FACG 09/18/2021, 10:18 AM

## 2021-09-25 ENCOUNTER — Telehealth: Payer: PPO | Admitting: Internal Medicine

## 2021-09-25 ENCOUNTER — Encounter: Payer: Self-pay | Admitting: Internal Medicine

## 2021-09-25 VITALS — BP 122/70 | HR 65 | Wt 200.0 lb

## 2021-09-25 DIAGNOSIS — E785 Hyperlipidemia, unspecified: Secondary | ICD-10-CM

## 2021-09-25 DIAGNOSIS — T466X5A Adverse effect of antihyperlipidemic and antiarteriosclerotic drugs, initial encounter: Secondary | ICD-10-CM

## 2021-09-25 DIAGNOSIS — M791 Myalgia, unspecified site: Secondary | ICD-10-CM

## 2021-09-25 DIAGNOSIS — T466X5D Adverse effect of antihyperlipidemic and antiarteriosclerotic drugs, subsequent encounter: Secondary | ICD-10-CM | POA: Diagnosis not present

## 2021-09-25 DIAGNOSIS — I2510 Atherosclerotic heart disease of native coronary artery without angina pectoris: Secondary | ICD-10-CM | POA: Diagnosis not present

## 2021-09-25 NOTE — Progress Notes (Signed)
Virtual Visit via Video Note   Because of Christy Jenkins's co-morbid illnesses, she is at least at moderate risk for complications without adequate follow up.  This format is felt to be most appropriate for this patient at this time.  All issues noted in this document were discussed and addressed.  A limited physical exam was performed with this format.  Please refer to the patient's chart for her consent to telehealth for [facility].      Date:  09/25/2021   ID:  Christy Jenkins, DOB [birth year], MRN [id number] The patient was identified using 2 identifiers.  Evaluation Performed:  Follow-Up Visit  Patient Location:  [place] [id number]  Provider location:   [address] [place], [place] [address]  PCP:  Christy Peers, MD  Cardiologist:  Christy Salines, DO Electrophysiologist:  None   Chief Complaint:  Follow-up dyslipidemia  History of Present Illness:    Christy Jenkins is a 71 y.o. female who presents via audio/video conferencing for a telehealth visit today.  This is a pleasant female kindly referred by Dr. Harriet Jenkins for evaluation management of dyslipidemia.  Recently she had been having some symptoms concerning for angina and underwent [place] CT angiography, which demonstrated extensive coronary calcium with a calcium score of 939, 97th percentile for age and sex matched controls.  FFR fortunately was negative however aggressive cardiovascular risk reduction was recommended.  Unfortunately, she cannot tolerate statins.  In the past she has tried "almost every statin".  This was met with severe muscle aches which occurred within days of starting the medication and seem to resolve after stopping it.  Subsequently she was placed on ezetimibe however her lipids remain significantly elevated.  Recent total cholesterol was 236, triglycerides 90 HDL 87 LDL 131.  09/25/2021  Christy Jenkins returns today for follow-up.  Overall she says she is doing very well  with Repatha.  She has had some fatigue which she gets for about 1 to 2 days after injection.  This has been pretty consistent and has not really improved however she says is very tolerable and she was very excited about her degree of cholesterol lowering and would like to continue with treatment.  Total cholesterol now 163, triglycerides 91, HDL 95 and LDL 50 (down from 131).  Continues on ezetimibe as well.  The patient does not have symptoms concerning for COVID-19 infection (fever, chills, cough, or new SHORTNESS OF BREATH).    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    Hypertension    Stroke ([place])    Tremors of nervous system     Past Surgical History:  Procedure Laterality Date   ABDOMINAL HYSTERECTOMY     CHOLECYSTECTOMY     ESOPHAGEAL MANOMETRY N/A 09/06/2021   Procedure: ESOPHAGEAL MANOMETRY (EM);  Surgeon: Lavena Bullion, DO;  Location: [facility];  Service: Gastroenterology;  Laterality: N/A;   HERNIA REPAIR     TONSILLECTOMY      FAMHx:  Family History  Problem Relation Age of Onset   Heart attack Mother    Heart disease Mother    Hypertension Mother    Diabetes Father    Heart disease Father    Diabetes Brother    Colon polyps Brother  Stomach cancer Neg Hx    Esophageal cancer Neg Hx     SOCHx:   reports that she has quit smoking. Her smoking use included cigarettes. She does not have any smokeless tobacco history on file. She reports that she does not currently use alcohol. No history on file for drug use.  ALLERGIES:  Allergies  Allergen Reactions   Other Swelling, Cough, Itching, Shortness Of Breath and Tinitus   Statins    Tape     MEDS:  Current Meds  Medication Sig   albuterol (VENTOLIN HFA) 108 (90 Base) MCG/ACT inhaler Inhale into the lungs.   ALPRAZolam  (XANAX) 0.25 MG tablet Take 0.25 mg by mouth 3 (three) times daily as needed.   aspirin EC 81 MG tablet Take 1 tablet (81 mg total) by mouth daily. Swallow whole.   Calcium Citrate-Vitamin D 315-5 MG-MCG TABS Take 2 tablets by mouth at bedtime.   cetirizine (ZYRTEC) 10 MG tablet Take by mouth.   Cholecalciferol 25 MCG (1000 UT) tablet Take by mouth.   escitalopram (LEXAPRO) 20 MG tablet Take 20 mg by mouth daily.   Evolocumab (REPATHA SURECLICK) 923 MG/ML SOAJ Inject 1 Dose into the skin every 14 (fourteen) days.   ezetimibe (ZETIA) 10 MG tablet Take by mouth.   fluticasone-salmeterol (ADVAIR) 100-50 MCG/ACT AEPB Inhale 1 puff into the lungs 2 (two) times daily.   gabapentin (NEURONTIN) 800 MG tablet Take by mouth.   latanoprost (XALATAN) 0.005 % ophthalmic solution    losartan (COZAAR) 100 MG tablet Take 1 tablet by mouth daily.   Magnesium 250 MG TABS    methocarbamol (ROBAXIN) 500 MG tablet Take 500 mg by mouth as needed.   montelukast (SINGULAIR) 10 MG tablet Take by mouth.   Multiple Vitamin (MULTI-VITAMIN) tablet Take by mouth.   naproxen (NAPROSYN) 500 MG tablet Take by mouth as needed.   pantoprazole (PROTONIX) 40 MG tablet Take 1 tablet by mouth 2 (two) times daily.   primidone (MYSOLINE) 50 MG tablet TAKE 1 TO 2 TABLETS BY MOUTH NIGHTLY   tiZANidine (ZANAFLEX) 2 MG tablet Take 1-2 tabs twice daily as needed for pain, insomnia.   vitamin B-12 (CYANOCOBALAMIN) 500 MCG tablet    zonisamide (ZONEGRAN) 100 MG capsule at bedtime as needed.     ROS: Pertinent items noted in HPI and remainder of comprehensive ROS otherwise negative.  Labs/Other Tests and Data Reviewed:    Recent Labs: 11/05/2020: Hemoglobin 13.3; Platelets 240 01/21/2021: BUN 12; Creatinine, Ser 0.83; Magnesium 2.1; Potassium 4.2; Sodium 140   Recent Lipid Panel No results found for: "CHOL", "TRIG", "HDL", "CHOLHDL", "LDLCALC", "LDLDIRECT"  Wt Readings from Last 3 Encounters:  09/25/21 200 lb (90.7 kg)  09/18/21  200 lb (90.7 kg)  08/22/21 205 lb (93 kg)     Exam:    Vital Signs:  BP 122/70   Pulse 65   Wt 200 lb (90.7 kg)   BMI 35.43 kg/m    General appearance: alert and no distress Lungs: no audible wheezing Abdomen: obese Extremities: extremities normal, atraumatic, no cyanosis or edema Neurologic: Grossly normal  ASSESSMENT & PLAN:    Mixed dyslipidemia, goal LDL less than 70 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  She is tolerating Repatha although has some fatigue for about 1 to 2 days after injection.  Oftentimes this improves over time but it really has not for her.  She still says it is very tolerable.  As long as she tolerates it is not dangerous and I think that we should continue with this therapy as that she has had a great response with her lipids.  She only has approval apparently for 6 months therefore we will follow-up in about 6 months.  She had a regular lipid performed through her PCP but I have encouraged an NMR and LP(a).  She will try to get that prior to her next visit.  COVID-19 Education: The signs and symptoms of COVID-19 were discussed with the patient and how to seek care for testing (follow up with PCP or arrange E-visit).  The importance of social distancing was discussed today.  Patient Risk:   After full review of this patients clinical status, I feel that they are at least moderate risk at this time.  Time:   Today, I have spent 15 minutes with the patient with telehealth technology discussing dyslipidemia.     Medication Adjustments/Labs and Tests Ordered: Current medicines are reviewed at length with the patient today.  Concerns  regarding medicines are outlined above.   Tests Ordered: No orders of the defined types were placed in this encounter.   Medication Changes: No orders of the defined types were placed in this encounter.   Disposition:  in 6 month(s)  Pixie Casino, MD, [facility], [place] Director of the Advanced Lipid Disorders &  Cardiovascular Risk Reduction Clinic Diplomate of the American Board of Clinical Lipidology Attending Cardiologist  Direct Dial: [phone]  Fax: [phone]  Website:  [email or web address]  Pixie Casino, MD  09/25/2021 8:45 AM

## 2021-09-25 NOTE — Patient Instructions (Signed)
Medication Instructions:  NO CHANGES  *If you need a refill on your cardiac medications before your next appointment, please call your pharmacy*   Lab Work: FASTING lab work to check cholesterol in 6 months -- complete about a week before next appointment   If you have labs (blood work) drawn today and your tests are completely normal, you will receive your results only by: [place] (if you have MyChart) OR A paper copy in the mail If you have any lab test that is abnormal or we need to change your treatment, we will call you to review the results.   Testing/Procedures: NONE   Follow-Up: At [facility], you and your health needs are our priority.  As part of our continuing mission to provide you with exceptional heart care, we have created designated Provider Care Teams.  These Care Teams include your primary Cardiologist (physician) and Advanced Practice Providers (APPs -  Physician Assistants and Nurse Practitioners) who all work together to provide you with the care you need, when you need it.  We recommend signing up for the patient portal called "MyChart".  Sign up information is provided on this After Visit Summary.  MyChart is used to connect with patients for Virtual Visits (Telemedicine).  Patients are able to view lab/test results, encounter notes, upcoming appointments, etc.  Non-urgent messages can be sent to your provider as well.   To learn more about what you can do with MyChart, go to [email or web address].    Your next appointment:   6 month(s)  The format for your next appointment:   In Person  Provider:   Lyman Bishop MD - lipid clinic

## 2021-10-07 DIAGNOSIS — R142 Eructation: Secondary | ICD-10-CM | POA: Insufficient documentation

## 2021-10-07 DIAGNOSIS — K5909 Other constipation: Secondary | ICD-10-CM | POA: Insufficient documentation

## 2021-10-07 DIAGNOSIS — K219 Gastro-esophageal reflux disease without esophagitis: Principal | ICD-10-CM

## 2021-10-07 DIAGNOSIS — R140 Abdominal distension (gaseous): Secondary | ICD-10-CM | POA: Insufficient documentation

## 2021-10-09 ENCOUNTER — Other Ambulatory Visit: Payer: Self-pay | Admitting: Surgery

## 2021-10-09 ENCOUNTER — Other Ambulatory Visit (HOSPITAL_COMMUNITY): Payer: Self-pay | Admitting: Surgery

## 2021-10-09 ENCOUNTER — Telehealth: Payer: Self-pay

## 2021-10-09 DIAGNOSIS — K5909 Other constipation: Secondary | ICD-10-CM

## 2021-10-09 DIAGNOSIS — R140 Abdominal distension (gaseous): Secondary | ICD-10-CM

## 2021-10-09 DIAGNOSIS — R142 Eructation: Secondary | ICD-10-CM

## 2021-10-09 DIAGNOSIS — K219 Gastro-esophageal reflux disease without esophagitis: Secondary | ICD-10-CM

## 2021-10-09 DIAGNOSIS — Z6835 Body mass index (BMI) 35.0-35.9, adult: Secondary | ICD-10-CM

## 2021-10-09 NOTE — Telephone Encounter (Signed)
Primary Cardiologist:Kardie Tobb, DO  Due to the time since she has been seen by Dr. Harriet Masson for general cardiology follow-up, Preoperative team, please contact this patient and set up a phone call appointment for further preoperative risk assessment. Please obtain consent and complete medication review. Thank you for your help.   Ideally aspirin should be continued without interruption, however if the bleeding risk is too great, aspirin may be held for 7 days prior to surgery. Please resume aspirin post operatively when it is felt to be safe from a bleeding standpoint.   Christy Life, NP-C    10/09/2021, 12:15 PM [place] [address], [address] Office [phone] Fax [phone]

## 2021-10-09 NOTE — Telephone Encounter (Signed)
   Pre-operative Risk Assessment    Patient Name: Christy Jenkins  DOB: 14-Oct-1950 MRN: [id number]      Request for Surgical Clearance    Procedure:   Hiatal Hernia Reduction and Reconstruction of Heartburn Valve  Date of Surgery:  Clearance TBD                                 Surgeon:  Michael Boston, MD Surgeon's Group or Practice Name:  [facility] Phone number:  [phone] Fax number:  [phone] Alisha Spillers   Type of Clearance Requested:   - Medical    Type of Anesthesia:  General    Additional requests/questions:  Please advise surgeon/provider what medications should be held.  Signed, Elsie Lincoln Makhiya Coburn   10/09/2021, 8:29 AM

## 2021-10-10 ENCOUNTER — Telehealth: Payer: Self-pay | Admitting: *Deleted

## 2021-10-10 NOTE — Telephone Encounter (Signed)
Pt is agreeable to plan of care for tele visit 10/17/21 @ 9 am for pre op. Med rec and consent are done.     Patient Consent for Virtual Visit        Christy Jenkins has provided verbal consent on 10/10/2021 for a virtual visit (video or telephone).   CONSENT FOR VIRTUAL VISIT FOR:  Christy Jenkins  By participating in this virtual visit I agree to the following:  I hereby voluntarily request, consent and authorize [facility] and its employed or contracted physicians, physician assistants, nurse practitioners or other licensed health care professionals (the Practitioner), to provide me with telemedicine health care services (the "Services") as deemed necessary by the treating Practitioner. I acknowledge and consent to receive the Services by the Practitioner via telemedicine. I understand that the telemedicine visit will involve communicating with the Practitioner through live audiovisual communication technology and the disclosure of certain medical information by electronic transmission. I acknowledge that I have been given the opportunity to request an in-person assessment or other available alternative prior to the telemedicine visit and am voluntarily participating in the telemedicine visit.  I understand that I have the right to withhold or withdraw my consent to the use of telemedicine in the course of my care at any time, without affecting my right to future care or treatment, and that the Practitioner or I may terminate the telemedicine visit at any time. I understand that I have the right to inspect all information obtained and/or recorded in the course of the telemedicine visit and may receive copies of available information for a reasonable fee.  I understand that some of the potential risks of receiving the Services via telemedicine include:  Delay or interruption in medical evaluation due to technological equipment failure or disruption; Information transmitted may not be sufficient  (e.g. poor resolution of images) to allow for appropriate medical decision making by the Practitioner; and/or  In rare instances, security protocols could fail, causing a breach of personal health information.  Furthermore, I acknowledge that it is my responsibility to provide information about my medical history, conditions and care that is complete and accurate to the best of my ability. I acknowledge that Practitioner's advice, recommendations, and/or decision may be based on factors not within their control, such as incomplete or inaccurate data provided by me or distortions of diagnostic images or specimens that may result from electronic transmissions. I understand that the practice of medicine is not an exact science and that Practitioner makes no warranties or guarantees regarding treatment outcomes. I acknowledge that a copy of this consent can be made available to me via my patient portal ([facility]), or I can request a printed copy by calling the office of [facility].    I understand that my insurance will be billed for this visit.   I have read or had this consent read to me. I understand the contents of this consent, which adequately explains the benefits and risks of the Services being provided via telemedicine.  I have been provided ample opportunity to ask questions regarding this consent and the Services and have had my questions answered to my satisfaction. I give my informed consent for the services to be provided through the use of telemedicine in my medical care

## 2021-10-10 NOTE — Telephone Encounter (Signed)
Pt is agreeable to plan of care for tele visit 10/17/21 @ 9 am for pre op. Med rec and consent are done.

## 2021-10-17 ENCOUNTER — Encounter: Payer: Self-pay | Admitting: Nurse Practitioner

## 2021-10-17 ENCOUNTER — Ambulatory Visit (INDEPENDENT_AMBULATORY_CARE_PROVIDER_SITE_OTHER): Payer: PPO | Admitting: Nurse Practitioner

## 2021-10-17 VITALS — BP 134/70

## 2021-10-17 DIAGNOSIS — Z01810 Encounter for preprocedural cardiovascular examination: Secondary | ICD-10-CM | POA: Diagnosis not present

## 2021-10-17 NOTE — Progress Notes (Signed)
Virtual Visit via Telephone Note   Because of Christy Jenkins's co-morbid illnesses, she is at least at moderate risk for complications without adequate follow up.  This format is felt to be most appropriate for this patient at this time.  The patient did not have access to video technology/had technical difficulties with video requiring transitioning to audio format only (telephone).  All issues noted in this document were discussed and addressed.  No physical exam could be performed with this format.  Please refer to the patient's chart for her consent to telehealth for [facility].  Evaluation Performed:  Preoperative cardiovascular risk assessment _____________   Date:  10/17/2021   Patient ID:  Christy Jenkins, DOB 12/26/1950, MRN [id number] Patient Location:  Home Provider location:   Office  Primary Care Provider:  Robyne Peers, MD Primary Cardiologist:  Berniece Salines, DO  Chief Complaint / Patient Profile   71 y.o. y/o female with a h/o non-obstructive coronary artery disease, hypertension, obesity, hyperlipidemia, elevated coronary calcium score, and a stroke that showed up on brain MRI but she is unaware of when it occurred who is pending hiatal hernia reduction and reconstruction of heartburn valve and presents today for telephonic preoperative cardiovascular risk assessment.  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 female who presents via audio/video conferencing for a telehealth visit today.  Pt was last seen in cardiology clinic on 05/22/21 by Dr. Harriet Masson.  At that time Christy Jenkins was doing well.  The patient is now pending procedure as outlined above. Since her last visit, she  denies chest pain, shortness of breath, lower extremity edema, fatigue, palpitations, melena, hematuria, hemoptysis, diaphoresis, weakness, presyncope, syncope, orthopnea, and PND.  Home Medications    Prior to Admission medications   Medication Sig Start Date End Date Taking? Authorizing Provider  albuterol (VENTOLIN HFA) 108 (90 Base) MCG/ACT inhaler Inhale into the lungs. 02/26/16   [provider]  ALPRAZolam Duanne Moron) 0.25 MG tablet Take 0.25 mg by mouth 3 (three) times daily as needed. 10/15/20   [provider]  aspirin EC 81 MG tablet Take 1 tablet (81 mg total) by mouth daily. Swallow whole. 01/31/21   Tobb, Kardie, DO  Calcium Citrate-Vitamin D 315-5 MG-MCG TABS Take 2 tablets by mouth at bedtime.    [provider]  cetirizine (ZYRTEC) 10 MG tablet Take by mouth.    [provider]  Cholecalciferol 25 MCG (1000 UT) tablet Take by mouth.    [provider]  escitalopram (LEXAPRO) 20 MG tablet Take 20 mg by mouth daily. 01/16/21   [provider]  Evolocumab (REPATHA SURECLICK) 409 MG/ML  SOAJ Inject 1 Dose into the skin every 14 (fourteen) days. 05/28/21   Hilty, Nadean Corwin, MD  ezetimibe (ZETIA) 10 MG tablet Take by mouth. 11/21/16   [provider]  fluticasone-salmeterol (ADVAIR) 100-50 MCG/ACT AEPB Inhale 1 puff into the lungs 2 (two) times daily. 08/17/21   [provider]  gabapentin (NEURONTIN) 800 MG tablet Take by mouth. 10/15/20   [provider]  latanoprost (XALATAN) 0.005 % ophthalmic solution   08/12/19   [provider]  losartan (COZAAR) 100 MG tablet Take 1 tablet by mouth daily. 10/15/20   [provider]  Magnesium 250 MG TABS     [provider]  methocarbamol (ROBAXIN) 500 MG tablet Take 500 mg by mouth as needed. 12/12/20   [provider]  montelukast (SINGULAIR) 10 MG tablet Take by mouth. 11/21/16   [provider]  Multiple Vitamin (MULTI-VITAMIN) tablet Take by mouth. 05/17/08   [provider]  naproxen (NAPROSYN) 500 MG tablet Take by mouth as needed. 05/17/08   [provider]  pantoprazole (PROTONIX) 40 MG tablet Take 1 tablet by mouth 2 (two) times daily. 11/21/16   [provider]  primidone (MYSOLINE) 50 MG tablet TAKE 1 TO 2 TABLETS BY MOUTH NIGHTLY 03/06/16   [provider]  tiZANidine (ZANAFLEX) 2 MG tablet Take 1-2 tabs twice daily as needed for pain, insomnia. 09/03/20   [provider]  vitamin B-12 (CYANOCOBALAMIN) 500 MCG tablet  09/29/18   [provider]  zonisamide (ZONEGRAN) 100 MG capsule at bedtime as needed. 09/03/20   [provider]    Physical Exam    Vital Signs:  Christy Jenkins does have a BP to report which is provided.   Given telephonic nature of communication, physical exam is limited. AAOx3. NAD. Normal affect.  Speech and respirations are unlabored.  Accessory Clinical Findings    None  Assessment & Plan    1.  Preoperative Cardiovascular Risk Assessment: The patient is doing well from a cardiac perspective. Therefore, based on ACC/AHA guidelines, the patient would be at acceptable risk for the planned procedure without further cardiovascular testing. The patient was advised that if he develops new symptoms prior to surgery to contact our office to arrange for a follow-up visit, and he verbalized understanding. According to the Revised Cardiac Risk Index (RCRI), her Perioperative Risk of Major Cardiac Event is (%): 6.6. Her Functional  Capacity in METs is: 6.61 according to the Duke Activity Status Index (DASI). She was advised to contact our office if she has any cardiac concerns prior to surgery.  A copy of this note will be routed to requesting surgeon.  Time:   Today, I have spent 10 minutes with the patient with telehealth technology discussing medical history, symptoms, and management plan.     Emmaline Life, NP-C    10/17/2021, [place] [address] Office [phone] Fax [phone]

## 2021-10-22 ENCOUNTER — Encounter (HOSPITAL_COMMUNITY)
Admission: RE | Admit: 2021-10-22 | Discharge: 2021-10-22 | Disposition: A | Discharge status: Home / Self Care | Payer: PPO | Source: Ambulatory Visit | Attending: Surgery | Admitting: Surgery

## 2021-10-22 DIAGNOSIS — Z6835 Body mass index (BMI) 35.0-35.9, adult: Secondary | ICD-10-CM | POA: Diagnosis present

## 2021-10-22 DIAGNOSIS — R142 Eructation: Secondary | ICD-10-CM | POA: Diagnosis present

## 2021-10-22 DIAGNOSIS — K219 Gastro-esophageal reflux disease without esophagitis: Secondary | ICD-10-CM | POA: Diagnosis present

## 2021-10-22 DIAGNOSIS — R140 Abdominal distension (gaseous): Secondary | ICD-10-CM | POA: Diagnosis present

## 2021-10-22 DIAGNOSIS — K5909 Other constipation: Secondary | ICD-10-CM | POA: Diagnosis present

## 2021-10-22 DIAGNOSIS — K449 Diaphragmatic hernia without obstruction or gangrene: Secondary | ICD-10-CM | POA: Diagnosis present

## 2021-10-22 MED ORDER — TECHNETIUM TC 99M SULFUR COLLOID
2.0000 | Freq: Once | INTRAVENOUS | Status: AC | PRN
  Administered 2021-10-22: 2 via INTRAVENOUS

## 2021-10-31 ENCOUNTER — Ambulatory Visit: Payer: Self-pay | Admitting: Surgery

## 2021-11-06 NOTE — Progress Notes (Signed)
COVID Vaccine Completed: yes x2  Date of COVID positive in last 90 days:  PCP - Rolan Lipa, MD Cardiologist - Berniece Salines, DO  Cardiac clearance 10/17/21 by Christen Bame in Epic  Chest x-ray -  EKG - 01/17/21 Epic Stress Test -  ECHO - 02/05/21 Epic Cardiac Cath -  Pacemaker/ICD device last checked: Spinal Cord Stimulator:  Bowel Prep -   Sleep Study -  CPAP -   Fasting Blood Sugar -  Checks Blood Sugar _____ times a day  Blood Thinner Instructions: Aspirin Instructions: ASA 81 Last Dose:  Activity level:  Can go up a flight of stairs and perform activities of daily living without stopping and without symptoms of chest pain or shortness of breath.  Able to exercise without symptoms  Unable to go up a flight of stairs without symptoms of     Anesthesia review: HTN, CAD, aortic atherosclerosis, stroke, dysphagia,   Patient denies shortness of breath, fever, cough and chest pain at PAT appointment  Patient verbalized understanding of instructions that were given to them at the PAT appointment. Patient was also instructed that they will need to review over the PAT instructions again at home before surgery.

## 2021-11-06 NOTE — Patient Instructions (Signed)
SURGICAL WAITING ROOM VISITATION Patients having surgery or a procedure may have no more than 2 support people in the waiting area - these visitors may rotate.   Children under the age of 64 must have an adult with them who is not the patient. If the patient needs to stay at the hospital during part of their recovery, the visitor guidelines for inpatient rooms apply. Pre-op nurse will coordinate an appropriate time for 1 support person to accompany patient in pre-op.  This support person may not rotate.    Please refer to the [facility] website for the visitor guidelines for Inpatients (after your surgery is over and you are in a regular room).    Your procedure is scheduled on: 11/13/21   Report to [facility] Main Entrance    Report to admitting at 11:15 AM   Call this number if you have problems the morning of surgery [phone]   Do not eat food :After Midnight.   After Midnight you may have the following liquids until 10:30 AM DAY OF SURGERY  Water Non-Citrus Juices (without pulp, NO RED) Carbonated Beverages Black Coffee (NO MILK/CREAM OR CREAMERS, sugar ok)  Clear Tea (NO MILK/CREAM OR CREAMERS, sugar ok) regular and decaf                             Plain Jell-O (NO RED)                                           Fruit ices (not with fruit pulp, NO RED)                                     Popsicles (NO RED)                                                               Sports drinks like Gatorade (NO RED)              Drink 2 Ensure drinks AT 10:00 PM the night before surgery.        The day of surgery:  Drink ONE (1) Pre-Surgery Clear Ensure at 10:30 AM the morning of surgery. Drink in one sitting. Do not sip.  This drink was given to you during your hospital  pre-op appointment visit. Nothing else to drink after completing the  Pre-Surgery Clear Ensure.          If you have questions, please contact your surgeon's office.   FOLLOW BOWEL PREP AND ANY  ADDITIONAL PRE OP INSTRUCTIONS YOU RECEIVED FROM YOUR SURGEON'S OFFICE!!!     Oral Hygiene is also important to reduce your risk of infection.                                    Remember - BRUSH YOUR TEETH THE MORNING OF SURGERY WITH YOUR REGULAR TOOTHPASTE   Take these medicines the morning of surgery with A SIP OF WATER: Inhalers, Xanax, Zyrtec, Pantoprazole  You may not have any metal on your body including hair pins, jewelry, and body piercing             Do not wear make-up, lotions, powders, perfumes, or deodorant  Do not wear nail polish including gel and S&S, artificial/acrylic nails, or any other type of covering on natural nails including finger and toenails. If you have artificial nails, gel coating, etc. that needs to be removed by a nail salon please have this removed prior to surgery or surgery may need to be canceled/ delayed if the surgeon/ anesthesia feels like they are unable to be safely monitored.   Do not shave  48 hours prior to surgery.    Do not bring valuables to the hospital. [place].   Bring small overnight bag day of surgery.   DO NOT [place]. PHARMACY WILL DISPENSE MEDICATIONS LISTED ON YOUR MEDICATION LIST TO YOU DURING YOUR ADMISSION [place]!    Special Instructions: Bring a copy of your healthcare power of attorney and living will documents         the day of surgery if you haven't scanned them before.              Please read over the following fact sheets you were given: IF YOU HAVE QUESTIONS ABOUT YOUR PRE-OP INSTRUCTIONS PLEASE CALL [place] - Preparing for Surgery Before surgery, you can play an important role.  Because skin is not sterile, your skin needs to be as free of germs as possible.  You can reduce the number of germs on your skin by washing with CHG (chlorahexidine gluconate) soap before  surgery.  CHG is an antiseptic cleaner which kills germs and bonds with the skin to continue killing germs even after washing. Please DO NOT use if you have an allergy to CHG or antibacterial soaps.  If your skin becomes reddened/irritated stop using the CHG and inform your nurse when you arrive at Short Stay. Do not shave (including legs and underarms) for at least 48 hours prior to the first CHG shower.  You may shave your face/neck.  Please follow these instructions carefully:  1.  Shower with CHG Soap the night before surgery and the  morning of surgery.  2.  If you choose to wash your hair, wash your hair first as usual with your normal  shampoo.  3.  After you shampoo, rinse your hair and body thoroughly to remove the shampoo.                             4.  Use CHG as you would any other liquid soap.  You can apply chg directly to the skin and wash.  Gently with a scrungie or clean washcloth.  5.  Apply the CHG Soap to your body ONLY FROM THE NECK DOWN.   Do   not use on face/ open                           Wound or open sores. Avoid contact with eyes, ears mouth and   genitals (private parts).                       Wash  face,  Genitals (private parts) with your normal soap.             6.  Wash thoroughly, paying special attention to the area where your    surgery  will be performed.  7.  Thoroughly rinse your body with warm water from the neck down.  8.  DO NOT shower/wash with your normal soap after using and rinsing off the CHG Soap.                9.  Pat yourself dry with a clean towel.            10.  Wear clean pajamas.            11.  Place clean sheets on your bed the night of your first shower and do not  sleep with pets. Day of Surgery : Do not apply any lotions/deodorants the morning of surgery.  Please wear clean clothes to the hospital/surgery center.  FAILURE TO FOLLOW THESE INSTRUCTIONS MAY RESULT IN THE CANCELLATION OF YOUR SURGERY  PATIENT  SIGNATURE_________________________________  NURSE SIGNATURE__________________________________  ________________________________________________________________________

## 2021-11-07 ENCOUNTER — Encounter (HOSPITAL_COMMUNITY)
Admission: RE | Admit: 2021-11-07 | Discharge: 2021-11-07 | Disposition: A | Discharge status: Home / Self Care | Payer: PPO | Source: Ambulatory Visit | Attending: Surgery | Admitting: Surgery

## 2021-11-07 ENCOUNTER — Encounter (HOSPITAL_COMMUNITY): Payer: Self-pay

## 2021-11-07 VITALS — BP 122/75 | HR 58 | Temp 98.2°F | Resp 12 | Ht 62.5 in | Wt 194.3 lb

## 2021-11-07 DIAGNOSIS — I2510 Atherosclerotic heart disease of native coronary artery without angina pectoris: Secondary | ICD-10-CM | POA: Diagnosis not present

## 2021-11-07 DIAGNOSIS — K449 Diaphragmatic hernia without obstruction or gangrene: Secondary | ICD-10-CM | POA: Diagnosis not present

## 2021-11-07 DIAGNOSIS — I10 Essential (primary) hypertension: Secondary | ICD-10-CM | POA: Diagnosis not present

## 2021-11-07 DIAGNOSIS — Z01812 Encounter for preprocedural laboratory examination: Secondary | ICD-10-CM | POA: Insufficient documentation

## 2021-11-07 DIAGNOSIS — Z8673 Personal history of transient ischemic attack (TIA), and cerebral infarction without residual deficits: Secondary | ICD-10-CM | POA: Diagnosis not present

## 2021-11-07 HISTORY — DX: Cardiac murmur, unspecified: R01.1

## 2021-11-07 HISTORY — DX: Unspecified osteoarthritis, unspecified site: M19.90

## 2021-11-07 HISTORY — DX: Cardiac arrhythmia, unspecified: I49.9

## 2021-11-07 HISTORY — DX: Atherosclerotic heart disease of native coronary artery without angina pectoris: I25.10

## 2021-11-07 HISTORY — DX: Headache, unspecified: R51.9

## 2021-11-07 HISTORY — DX: Pneumonia, unspecified organism: J18.9

## 2021-11-07 HISTORY — DX: Gastro-esophageal reflux disease without esophagitis: K21.9

## 2021-11-07 LAB — CBC
HCT: 40.2 % (ref 36.0–46.0)
Hemoglobin: 12.8 g/dL (ref 12.0–15.0)
MCH: 28.4 pg (ref 26.0–34.0)
MCHC: 31.8 g/dL (ref 30.0–36.0)
MCV: 89.3 fL (ref 80.0–100.0)
Platelets: 222 10*3/uL (ref 150–400)
RBC: 4.5 MIL/uL (ref 3.87–5.11)
RDW: 13.3 % (ref 11.5–15.5)
WBC: 6 10*3/uL (ref 4.0–10.5)
nRBC: 0 % (ref 0.0–0.2)

## 2021-11-07 LAB — BASIC METABOLIC PANEL
Anion gap: 6 (ref 5–15)
BUN: 19 mg/dL (ref 8–23)
CO2: 26 mmol/L (ref 22–32)
Calcium: 8.8 mg/dL — ABNORMAL LOW (ref 8.9–10.3)
Chloride: 108 mmol/L (ref 98–111)
Creatinine, Ser: 0.77 mg/dL (ref 0.44–1.00)
GFR, Estimated: >60 mL/min (ref >60)
Glucose, Bld: 91 mg/dL (ref 70–99)
Potassium: 4.5 mmol/L (ref 3.5–5.1)
Sodium: 140 mmol/L (ref 135–145)

## 2021-11-08 NOTE — Progress Notes (Signed)
Anesthesia Chart Review:   Case: [id number] Date/Time: 11/13/21 1245   Procedure: XI ROBOTIC ASSISTED PARAESOPHAGEAL HIATAL HERNIA REPAIR WITH FUNDOPLICATION   Anesthesia type: General   Pre-op diagnosis: PARAESOPHAGEAL HIATAL HERNIA WITH FUNDOPLICATION   [facility] RO[facility] / [facility]   Surgeons: Michael Boston, MD       DISCUSSION: Pt is 71 years old with hx CAD (mild by CT), CVA, HTN   VS: BP 122/75   Pulse (!) 58   Temp 36.8 C (Oral)   Resp 12   Ht 5' 2.5" (1.588 m)   Wt 88.1 kg   SpO2 100%   BMI 34.97 kg/m   PROVIDERS: - PCP is Robyne Peers, MD - Cardiologist is Berniece Salines, DO. Last office visit 05/22/21. Pt cleared for surgery at acceptable risk in telephone visit with Christen Bame, NP 10/17/21  LABS: Labs reviewed: Acceptable for surgery. (all labs ordered are listed, but only abnormal results are displayed)  Labs Reviewed  BASIC METABOLIC PANEL - Abnormal; Notable for the following components:      Result Value   Calcium 8.8 (*)    All other components within normal limits  CBC     IMAGES: [place] abd 06/03/21:  - There is 3.8 cm cyst with lobulated margins in the left lobe of liver which has slightly increased in size since 05/04/2017. Status post cholecystectomy.  CT cardiac radiology overread 01/28/21:  1.  Aortic Atherosclerosis (ICD10-I70.0). 2. Moderate-sized hiatal hernia  EKG 01/17/21: sinus bradycardia   CV: Echo 02/05/21:  1. Probable liver cyst; suggest abdominal ultrasound to further assess.  2. Left ventricular ejection fraction, by estimation, is 60 to 65%. The left ventricle has normal function. The left ventricle has no regional wall motion abnormalities. Left ventricular diastolic parameters were normal. The average left ventricular global longitudinal strain is -23.1 %. The global longitudinal strain is normal.  3. Right ventricular systolic function is normal. The right ventricular size is normal. There is normal pulmonary artery  systolic pressure.  4. The mitral valve is normal in structure. Trivial mitral valve regurgitation. No evidence of mitral stenosis.  5. The aortic valve is tricuspid. Aortic valve regurgitation is not visualized. No aortic stenosis is present.  6. The inferior vena cava is normal in size with greater than 50% respiratory variability, suggesting right atrial pressure of 3 mmHg.   CT coronary morphology and FFR 01/28/21:  1. Coronary artery calcium score 939 Agatston units. This places the patient in the 97th percentile for age and gender, suggesting high risk for future cardiac events. 2. Extensive calcified plaque left main, proximal/mid LAD, and RCA. Suspect only mild stenosis, but given significant calcification with blooming artifact, will send for FFR - CT FFR analysis didn't show any significant stenosis.   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    Dysrhythmia    brady   GERD (gastroesophageal reflux disease)    Headache    mirgaines   Heart murmur    Hypertension    Pneumonia    Stroke [facility])    Tremors of nervous system     Past Surgical History:  Procedure Laterality Date   ABDOMINAL HYSTERECTOMY     BLADDER SUSPENSION  2001   CHOLECYSTECTOMY     ESOPHAGEAL MANOMETRY N/A 09/06/2021   Procedure: ESOPHAGEAL MANOMETRY (  EM);  Surgeon: Lavena Bullion, DO;  [facility];  Service: Gastroenterology;  Laterality: N/A;   HERNIA REPAIR     LUMBAR DISC SURGERY     right foot surgery     rods and pins   TONSILLECTOMY     WISDOM TOOTH EXTRACTION  1978   WRIST SURGERY Bilateral     MEDICATIONS:  albuterol (VENTOLIN HFA) 108 (90 Base) MCG/ACT inhaler   ALPRAZolam (XANAX) 0.25 MG tablet   aspirin EC 81 MG tablet   CALCIUM CITRATE-VITAMIN D PO   cetirizine (ZYRTEC) 10 MG tablet   Cholecalciferol 25 MCG (1000 UT) tablet    escitalopram (LEXAPRO) 20 MG tablet   Evolocumab (REPATHA SURECLICK) 941 MG/ML SOAJ   ezetimibe (ZETIA) 10 MG tablet   fluticasone-salmeterol (ADVAIR) 100-50 MCG/ACT AEPB   gabapentin (NEURONTIN) 800 MG tablet   latanoprost (XALATAN) 0.005 % ophthalmic solution   losartan (COZAAR) 100 MG tablet   Magnesium 250 MG TABS   methocarbamol (ROBAXIN) 500 MG tablet   montelukast (SINGULAIR) 10 MG tablet   Multiple Vitamin (MULTI-VITAMIN) tablet   naproxen (NAPROSYN) 500 MG tablet   pantoprazole (PROTONIX) 40 MG tablet   primidone (MYSOLINE) 50 MG tablet   tiZANidine (ZANAFLEX) 2 MG tablet   vitamin B-12 (CYANOCOBALAMIN) 500 MCG tablet   zonisamide (ZONEGRAN) 100 MG capsule   No current facility-administered medications for this encounter.    If no changes, I anticipate pt can proceed with surgery as scheduled.   Willeen Cass, PhD, FNP-BC [facility]/Anesthesiology Phone: [phone] 11/08/2021 9:32 AM

## 2021-11-08 NOTE — Anesthesia Preprocedure Evaluation (Signed)
Anesthesia Evaluation  Patient identified by MRN, date of birth, ID band Patient awake    Reviewed: Allergy & Precautions, H&P , NPO status , Patient's Chart, lab work & pertinent test results  Airway Mallampati: III  TM Distance: >3 FB Neck ROM: Full    Dental no notable dental hx. (+) Teeth Intact, Dental Advisory Given   Pulmonary asthma , former smoker,    Pulmonary exam normal breath sounds clear to auscultation       Cardiovascular hypertension, Pt. on medications + CAD   Rhythm:Regular Rate:Normal     Neuro/Psych  Headaches, Anxiety Depression CVA, No Residual Symptoms    GI/Hepatic Neg liver ROS, hiatal hernia, GERD  Medicated,  Endo/Other  negative endocrine ROS  Renal/GU negative Renal ROS  negative genitourinary   Musculoskeletal  (+) Arthritis , Osteoarthritis,    Abdominal   Peds  Hematology negative hematology ROS (+)   Anesthesia Other Findings   Reproductive/Obstetrics negative OB ROS                           Anesthesia Physical Anesthesia Plan  ASA: 3  Anesthesia Plan: General   Post-op Pain Management: Tylenol PO (pre-op)*   Induction: Intravenous  PONV Risk Score and Plan: 4 or greater and Ondansetron, Dexamethasone and Treatment may vary due to age or medical condition  Airway Management Planned: Oral ETT  Additional Equipment:   Intra-op Plan:   Post-operative Plan: Extubation in OR  Informed Consent: I have reviewed the patients History and Physical, chart, labs and discussed the procedure including the risks, benefits and alternatives for the proposed anesthesia with the patient or authorized representative who has indicated his/her understanding and acceptance.     Dental advisory given  Plan Discussed with: CRNA  Anesthesia Plan Comments: (See APP note by Durel Salts, FNP )       Anesthesia Quick Evaluation

## 2021-11-12 NOTE — Progress Notes (Signed)
Pt aware to arrive at [facility] admitting at 10:45 am on Wednesday 11/13/2021 for scheduled surgical procedure. Finish pre surgery drink by 10 am then nothing by mouth.

## 2021-11-13 ENCOUNTER — Inpatient Hospital Stay (HOSPITAL_COMMUNITY)
Admission: RE | Admit: 2021-11-13 | Discharge: 2021-11-15 | DRG: 328 | Disposition: A | Discharge status: Home / Self Care | Payer: PPO | Attending: Surgery | Admitting: Surgery

## 2021-11-13 ENCOUNTER — Other Ambulatory Visit: Payer: Self-pay

## 2021-11-13 ENCOUNTER — Encounter (HOSPITAL_COMMUNITY): Payer: Self-pay | Admitting: Surgery

## 2021-11-13 ENCOUNTER — Encounter (HOSPITAL_COMMUNITY)
Admission: RE | Disposition: A | Discharge status: Home / Self Care | Payer: Self-pay | Source: Home / Self Care | Attending: Surgery

## 2021-11-13 ENCOUNTER — Ambulatory Visit (HOSPITAL_COMMUNITY): Payer: PPO | Admitting: Registered Nurse

## 2021-11-13 ENCOUNTER — Ambulatory Visit (HOSPITAL_COMMUNITY): Payer: PPO | Admitting: Emergency Medicine

## 2021-11-13 DIAGNOSIS — Z8701 Personal history of pneumonia (recurrent): Secondary | ICD-10-CM | POA: Diagnosis not present

## 2021-11-13 DIAGNOSIS — F419 Anxiety disorder, unspecified: Secondary | ICD-10-CM | POA: Diagnosis present

## 2021-11-13 DIAGNOSIS — Z90710 Acquired absence of both cervix and uterus: Secondary | ICD-10-CM

## 2021-11-13 DIAGNOSIS — H409 Unspecified glaucoma: Secondary | ICD-10-CM | POA: Diagnosis present

## 2021-11-13 DIAGNOSIS — R1310 Dysphagia, unspecified: Secondary | ICD-10-CM | POA: Diagnosis present

## 2021-11-13 DIAGNOSIS — K5909 Other constipation: Secondary | ICD-10-CM | POA: Diagnosis present

## 2021-11-13 DIAGNOSIS — Z8673 Personal history of transient ischemic attack (TIA), and cerebral infarction without residual deficits: Secondary | ICD-10-CM

## 2021-11-13 DIAGNOSIS — Z7951 Long term (current) use of inhaled steroids: Secondary | ICD-10-CM

## 2021-11-13 DIAGNOSIS — Z87891 Personal history of nicotine dependence: Secondary | ICD-10-CM

## 2021-11-13 DIAGNOSIS — Z8249 Family history of ischemic heart disease and other diseases of the circulatory system: Secondary | ICD-10-CM | POA: Diagnosis not present

## 2021-11-13 DIAGNOSIS — Z888 Allergy status to other drugs, medicaments and biological substances status: Secondary | ICD-10-CM

## 2021-11-13 DIAGNOSIS — J45909 Unspecified asthma, uncomplicated: Secondary | ICD-10-CM | POA: Diagnosis present

## 2021-11-13 DIAGNOSIS — K224 Dyskinesia of esophagus: Secondary | ICD-10-CM | POA: Diagnosis present

## 2021-11-13 DIAGNOSIS — E669 Obesity, unspecified: Secondary | ICD-10-CM | POA: Diagnosis present

## 2021-11-13 DIAGNOSIS — I10 Essential (primary) hypertension: Secondary | ICD-10-CM

## 2021-11-13 DIAGNOSIS — Z6835 Body mass index (BMI) 35.0-35.9, adult: Secondary | ICD-10-CM

## 2021-11-13 DIAGNOSIS — M1990 Unspecified osteoarthritis, unspecified site: Secondary | ICD-10-CM | POA: Diagnosis present

## 2021-11-13 DIAGNOSIS — Z9889 Other specified postprocedural states: Secondary | ICD-10-CM | POA: Diagnosis present

## 2021-11-13 DIAGNOSIS — K449 Diaphragmatic hernia without obstruction or gangrene: Secondary | ICD-10-CM | POA: Diagnosis present

## 2021-11-13 DIAGNOSIS — F32A Depression, unspecified: Secondary | ICD-10-CM | POA: Diagnosis present

## 2021-11-13 DIAGNOSIS — H5789 Other specified disorders of eye and adnexa: Secondary | ICD-10-CM | POA: Diagnosis present

## 2021-11-13 DIAGNOSIS — K2100 Gastro-esophageal reflux disease with esophagitis, without bleeding: Secondary | ICD-10-CM | POA: Diagnosis present

## 2021-11-13 DIAGNOSIS — G8929 Other chronic pain: Secondary | ICD-10-CM | POA: Diagnosis present

## 2021-11-13 DIAGNOSIS — R011 Cardiac murmur, unspecified: Secondary | ICD-10-CM | POA: Diagnosis present

## 2021-11-13 DIAGNOSIS — I2510 Atherosclerotic heart disease of native coronary artery without angina pectoris: Secondary | ICD-10-CM | POA: Diagnosis present

## 2021-11-13 DIAGNOSIS — F4322 Adjustment disorder with anxiety: Secondary | ICD-10-CM | POA: Insufficient documentation

## 2021-11-13 DIAGNOSIS — Z9049 Acquired absence of other specified parts of digestive tract: Secondary | ICD-10-CM

## 2021-11-13 DIAGNOSIS — Z9109 Other allergy status, other than to drugs and biological substances: Secondary | ICD-10-CM

## 2021-11-13 DIAGNOSIS — Z79620 Long term (current) use of immunosuppressive biologic: Secondary | ICD-10-CM

## 2021-11-13 DIAGNOSIS — K219 Gastro-esophageal reflux disease without esophagitis: Principal | ICD-10-CM

## 2021-11-13 DIAGNOSIS — I700 Atherosclerosis of aorta: Secondary | ICD-10-CM | POA: Diagnosis present

## 2021-11-13 DIAGNOSIS — E7800 Pure hypercholesterolemia, unspecified: Secondary | ICD-10-CM | POA: Diagnosis present

## 2021-11-13 DIAGNOSIS — Z79899 Other long term (current) drug therapy: Secondary | ICD-10-CM

## 2021-11-13 DIAGNOSIS — Z7982 Long term (current) use of aspirin: Secondary | ICD-10-CM

## 2021-11-13 DIAGNOSIS — G43909 Migraine, unspecified, not intractable, without status migrainosus: Secondary | ICD-10-CM | POA: Diagnosis present

## 2021-11-13 HISTORY — PX: XI ROBOTIC ASSISTED PARAESOPHAGEAL HERNIA REPAIR: SHX6871

## 2021-11-13 SURGERY — REPAIR, HERNIA, PARAESOPHAGEAL, ROBOT-ASSISTED
Anesthesia: General

## 2021-11-13 MED ORDER — ALBUTEROL SULFATE (2.5 MG/3ML) 0.083% IN NEBU: 2.5000 mg | INHALATION_SOLUTION | RESPIRATORY_TRACT | Status: DC | PRN

## 2021-11-13 MED ORDER — METHOCARBAMOL 500 MG PO TABS: 500.0000 mg | ORAL_TABLET | Freq: Three times a day (TID) | ORAL | Status: DC | PRN

## 2021-11-13 MED ORDER — ROCURONIUM BROMIDE 10 MG/ML (PF) SYRINGE
PREFILLED_SYRINGE | INTRAVENOUS | Status: DC | PRN
  Administered 2021-11-13: 60 mg via INTRAVENOUS
  Administered 2021-11-13: 40 mg via INTRAVENOUS
  Administered 2021-11-13: 10 mg via INTRAVENOUS

## 2021-11-13 MED ORDER — PANTOPRAZOLE SODIUM 40 MG PO TBEC
40.0000 mg | DELAYED_RELEASE_TABLET | Freq: Two times a day (BID) | ORAL | Status: DC
  Administered 2021-11-13 – 2021-11-14 (×3): 40 mg via ORAL
  Filled 2021-11-13 (×3): qty 1

## 2021-11-13 MED ORDER — GABAPENTIN 300 MG PO CAPS: 300.0000 mg | ORAL_CAPSULE | Freq: Two times a day (BID) | ORAL | Status: DC

## 2021-11-13 MED ORDER — CHLORHEXIDINE GLUCONATE CLOTH 2 % EX PADS: 6.0000 | MEDICATED_PAD | Freq: Once | CUTANEOUS | Status: DC

## 2021-11-13 MED ORDER — DEXAMETHASONE SODIUM PHOSPHATE 10 MG/ML IJ SOLN
INTRAMUSCULAR | Status: AC
  Filled 2021-11-13: qty 1

## 2021-11-13 MED ORDER — HYDROMORPHONE HCL 1 MG/ML IJ SOLN
0.2500 mg | INTRAMUSCULAR | Status: DC | PRN
  Administered 2021-11-13: 0.5 mg via INTRAVENOUS

## 2021-11-13 MED ORDER — SIMETHICONE 80 MG PO CHEW: 40.0000 mg | CHEWABLE_TABLET | Freq: Four times a day (QID) | ORAL | Status: DC | PRN

## 2021-11-13 MED ORDER — OXYCODONE HCL 5 MG PO TABS: 5.0000 mg | ORAL_TABLET | Freq: Four times a day (QID) | ORAL | 0 refills | Status: DC | PRN

## 2021-11-13 MED ORDER — ONDANSETRON HCL 4 MG PO TABS: 4.0000 mg | ORAL_TABLET | Freq: Three times a day (TID) | ORAL | 10 refills | Status: DC | PRN

## 2021-11-13 MED ORDER — ACETAMINOPHEN 500 MG PO TABS
1000.0000 mg | ORAL_TABLET | ORAL | Status: AC
  Administered 2021-11-13: 1000 mg via ORAL
  Filled 2021-11-13: qty 2

## 2021-11-13 MED ORDER — METRONIDAZOLE 500 MG/100ML IV SOLN
500.0000 mg | INTRAVENOUS | Status: AC
  Administered 2021-11-13: 500 mg via INTRAVENOUS
  Filled 2021-11-13: qty 100

## 2021-11-13 MED ORDER — PROPOFOL 10 MG/ML IV BOLUS
INTRAVENOUS | Status: DC | PRN
  Administered 2021-11-13: 150 mg via INTRAVENOUS

## 2021-11-13 MED ORDER — ZONISAMIDE 100 MG PO CAPS
100.0000 mg | ORAL_CAPSULE | Freq: Every day | ORAL | Status: DC
  Administered 2021-11-13 – 2021-11-14 (×2): 100 mg via ORAL
  Filled 2021-11-13 (×2): qty 1

## 2021-11-13 MED ORDER — TIZANIDINE HCL 4 MG PO TABS: 2.0000 mg | ORAL_TABLET | Freq: Three times a day (TID) | ORAL | Status: DC | PRN

## 2021-11-13 MED ORDER — ONDANSETRON HCL 4 MG/2ML IJ SOLN
INTRAMUSCULAR | Status: DC | PRN
  Administered 2021-11-13: 4 mg via INTRAVENOUS

## 2021-11-13 MED ORDER — SCOPOLAMINE 1 MG/3DAYS TD PT72
1.0000 | MEDICATED_PATCH | TRANSDERMAL | Status: DC
  Administered 2021-11-13: 1.5 mg via TRANSDERMAL
  Filled 2021-11-13: qty 1

## 2021-11-13 MED ORDER — DEXAMETHASONE SODIUM PHOSPHATE 4 MG/ML IJ SOLN: 4.0000 mg | INTRAMUSCULAR | Status: DC

## 2021-11-13 MED ORDER — OXYCODONE HCL 5 MG PO TABS
5.0000 mg | ORAL_TABLET | ORAL | Status: DC | PRN
  Administered 2021-11-13 – 2021-11-14 (×2): 10 mg via ORAL
  Filled 2021-11-13 (×2): qty 2

## 2021-11-13 MED ORDER — VITAMIN B-12 1000 MCG PO TABS
500.0000 ug | ORAL_TABLET | Freq: Every day | ORAL | Status: DC
  Administered 2021-11-14 – 2021-11-15 (×2): 500 ug via ORAL
  Filled 2021-11-13 (×2): qty 1

## 2021-11-13 MED ORDER — ALBUMIN HUMAN 5 % IV SOLN: INTRAVENOUS | Status: DC | PRN

## 2021-11-13 MED ORDER — DIPHENHYDRAMINE HCL 50 MG/ML IJ SOLN: 12.5000 mg | Freq: Four times a day (QID) | INTRAMUSCULAR | Status: DC | PRN

## 2021-11-13 MED ORDER — MIDAZOLAM HCL 2 MG/2ML IJ SOLN
INTRAMUSCULAR | Status: AC
  Filled 2021-11-13: qty 2

## 2021-11-13 MED ORDER — MOMETASONE FURO-FORMOTEROL FUM 100-5 MCG/ACT IN AERO
2.0000 | INHALATION_SPRAY | Freq: Two times a day (BID) | RESPIRATORY_TRACT | Status: DC
  Administered 2021-11-13 – 2021-11-15 (×4): 2 via RESPIRATORY_TRACT
  Filled 2021-11-13: qty 8.8

## 2021-11-13 MED ORDER — LACTATED RINGERS IR SOLN
Status: DC | PRN
  Administered 2021-11-13: 1000 mL

## 2021-11-13 MED ORDER — MAGNESIUM 250 MG PO TABS: 250.0000 mg | ORAL_TABLET | Freq: Every day | ORAL | Status: DC

## 2021-11-13 MED ORDER — PROCHLORPERAZINE MALEATE 10 MG PO TABS: 10.0000 mg | ORAL_TABLET | Freq: Four times a day (QID) | ORAL | Status: DC | PRN

## 2021-11-13 MED ORDER — MONTELUKAST SODIUM 10 MG PO TABS
10.0000 mg | ORAL_TABLET | Freq: Every day | ORAL | Status: DC
  Administered 2021-11-13 – 2021-11-14 (×2): 10 mg via ORAL
  Filled 2021-11-13 (×2): qty 1

## 2021-11-13 MED ORDER — ONDANSETRON 4 MG PO TBDP: 4.0000 mg | ORAL_TABLET | Freq: Four times a day (QID) | ORAL | Status: DC | PRN

## 2021-11-13 MED ORDER — PROPOFOL 10 MG/ML IV BOLUS
INTRAVENOUS | Status: AC
  Filled 2021-11-13: qty 20

## 2021-11-13 MED ORDER — MIDAZOLAM HCL 5 MG/5ML IJ SOLN
INTRAMUSCULAR | Status: DC | PRN
  Administered 2021-11-13: 2 mg via INTRAVENOUS

## 2021-11-13 MED ORDER — LORATADINE 10 MG PO TABS
10.0000 mg | ORAL_TABLET | Freq: Every day | ORAL | Status: DC
  Administered 2021-11-14 – 2021-11-15 (×2): 10 mg via ORAL
  Filled 2021-11-13 (×2): qty 1

## 2021-11-13 MED ORDER — BUPIVACAINE LIPOSOME 1.3 % IJ SUSP: 20.0000 mL | Freq: Once | INTRAMUSCULAR | Status: DC

## 2021-11-13 MED ORDER — LACTATED RINGERS IV SOLN: INTRAVENOUS | Status: DC

## 2021-11-13 MED ORDER — LIDOCAINE HCL 2 % IJ SOLN
INTRAMUSCULAR | Status: AC
  Filled 2021-11-13: qty 20

## 2021-11-13 MED ORDER — BUPIVACAINE-EPINEPHRINE 0.25% -1:200000 IJ SOLN
INTRAMUSCULAR | Status: DC | PRN
  Administered 2021-11-13: 30 mL

## 2021-11-13 MED ORDER — ASPIRIN 81 MG PO TBEC
81.0000 mg | DELAYED_RELEASE_TABLET | Freq: Every day | ORAL | Status: DC
  Administered 2021-11-14 – 2021-11-15 (×2): 81 mg via ORAL
  Filled 2021-11-13 (×2): qty 1

## 2021-11-13 MED ORDER — LACTATED RINGERS IV SOLN: INTRAVENOUS | Status: DC | PRN

## 2021-11-13 MED ORDER — CHLORHEXIDINE GLUCONATE 0.12 % MT SOLN
15.0000 mL | Freq: Once | OROMUCOSAL | Status: AC
  Administered 2021-11-13: 15 mL via OROMUCOSAL

## 2021-11-13 MED ORDER — HYDROMORPHONE HCL 1 MG/ML IJ SOLN
0.5000 mg | INTRAMUSCULAR | Status: DC | PRN
  Administered 2021-11-14: 1 mg via INTRAVENOUS
  Filled 2021-11-13: qty 1

## 2021-11-13 MED ORDER — LIDOCAINE 2% (20 MG/ML) 5 ML SYRINGE
INTRAMUSCULAR | Status: AC
  Filled 2021-11-13: qty 5

## 2021-11-13 MED ORDER — PHENYLEPHRINE HCL-NACL 20-0.9 MG/250ML-% IV SOLN
INTRAVENOUS | Status: AC
  Filled 2021-11-13: qty 250

## 2021-11-13 MED ORDER — SIMETHICONE 80 MG PO CHEW
80.0000 mg | CHEWABLE_TABLET | Freq: Four times a day (QID) | ORAL | Status: DC
  Administered 2021-11-13 – 2021-11-15 (×6): 80 mg via ORAL
  Filled 2021-11-13 (×5): qty 1

## 2021-11-13 MED ORDER — ADULT MULTIVITAMIN W/MINERALS CH
1.0000 | ORAL_TABLET | Freq: Every day | ORAL | Status: DC
  Administered 2021-11-14 – 2021-11-15 (×2): 1 via ORAL
  Filled 2021-11-13 (×4): qty 1

## 2021-11-13 MED ORDER — SODIUM CHLORIDE 0.9 % IV SOLN
Freq: Three times a day (TID) | INTRAVENOUS | Status: DC | PRN
Start: 2021-11-13 — End: 2021-11-15

## 2021-11-13 MED ORDER — LOSARTAN POTASSIUM 50 MG PO TABS
100.0000 mg | ORAL_TABLET | Freq: Every day | ORAL | Status: DC
  Administered 2021-11-14 – 2021-11-15 (×2): 100 mg via ORAL
  Filled 2021-11-13 (×2): qty 2

## 2021-11-13 MED ORDER — EZETIMIBE 10 MG PO TABS
10.0000 mg | ORAL_TABLET | Freq: Every day | ORAL | Status: DC
  Administered 2021-11-13 – 2021-11-14 (×2): 10 mg via ORAL
  Filled 2021-11-13 (×2): qty 1

## 2021-11-13 MED ORDER — ENALAPRILAT 1.25 MG/ML IV SOLN: 0.6250 mg | Freq: Four times a day (QID) | INTRAVENOUS | Status: DC | PRN

## 2021-11-13 MED ORDER — BISACODYL 10 MG RE SUPP: 10.0000 mg | Freq: Every day | RECTAL | Status: DC | PRN

## 2021-11-13 MED ORDER — ESCITALOPRAM OXALATE 20 MG PO TABS
20.0000 mg | ORAL_TABLET | Freq: Every day | ORAL | Status: DC
  Administered 2021-11-13 – 2021-11-14 (×2): 20 mg via ORAL
  Filled 2021-11-13 (×2): qty 1

## 2021-11-13 MED ORDER — HYDROMORPHONE HCL 1 MG/ML IJ SOLN
INTRAMUSCULAR | Status: AC
  Filled 2021-11-13: qty 1

## 2021-11-13 MED ORDER — METOPROLOL TARTRATE 5 MG/5ML IV SOLN: 5.0000 mg | Freq: Four times a day (QID) | INTRAVENOUS | Status: DC | PRN

## 2021-11-13 MED ORDER — GLYCOPYRROLATE 0.2 MG/ML IJ SOLN
INTRAMUSCULAR | Status: DC | PRN
  Administered 2021-11-13: .2 mg via INTRAVENOUS

## 2021-11-13 MED ORDER — ENOXAPARIN SODIUM 40 MG/0.4ML IJ SOSY
40.0000 mg | PREFILLED_SYRINGE | Freq: Every day | INTRAMUSCULAR | Status: DC
  Administered 2021-11-14: 40 mg via SUBCUTANEOUS
  Filled 2021-11-13: qty 0.4

## 2021-11-13 MED ORDER — NAPROXEN 250 MG PO TABS
500.0000 mg | ORAL_TABLET | Freq: Every day | ORAL | Status: DC | PRN
Start: 2021-11-13 — End: 2021-11-15

## 2021-11-13 MED ORDER — FENTANYL CITRATE (PF) 100 MCG/2ML IJ SOLN
INTRAMUSCULAR | Status: DC | PRN
  Administered 2021-11-13 (×2): 50 ug via INTRAVENOUS

## 2021-11-13 MED ORDER — ONDANSETRON HCL 4 MG/2ML IJ SOLN
4.0000 mg | Freq: Four times a day (QID) | INTRAMUSCULAR | Status: DC | PRN
  Administered 2021-11-13: 4 mg via INTRAVENOUS
  Filled 2021-11-13: qty 2

## 2021-11-13 MED ORDER — METHOCARBAMOL 500 MG PO TABS
500.0000 mg | ORAL_TABLET | Freq: Four times a day (QID) | ORAL | Status: DC | PRN
  Administered 2021-11-13 – 2021-11-14 (×2): 500 mg via ORAL
  Filled 2021-11-13 (×2): qty 1

## 2021-11-13 MED ORDER — BUPIVACAINE-EPINEPHRINE (PF) 0.5% -1:200000 IJ SOLN
INTRAMUSCULAR | Status: AC
  Filled 2021-11-13: qty 30

## 2021-11-13 MED ORDER — DEXAMETHASONE SODIUM PHOSPHATE 10 MG/ML IJ SOLN
INTRAMUSCULAR | Status: DC | PRN
  Administered 2021-11-13: 10 mg via INTRAVENOUS

## 2021-11-13 MED ORDER — ORAL CARE MOUTH RINSE: 15.0000 mL | Freq: Once | OROMUCOSAL | Status: AC

## 2021-11-13 MED ORDER — SODIUM CHLORIDE 0.9 % IV SOLN
2.0000 g | INTRAVENOUS | Status: AC
  Administered 2021-11-13: 2 g via INTRAVENOUS
  Filled 2021-11-13: qty 20

## 2021-11-13 MED ORDER — ENSURE PRE-SURGERY PO LIQD: 296.0000 mL | Freq: Once | ORAL | Status: DC

## 2021-11-13 MED ORDER — MAGNESIUM OXIDE -MG SUPPLEMENT 400 (240 MG) MG PO TABS
200.0000 mg | ORAL_TABLET | Freq: Every day | ORAL | Status: DC
  Administered 2021-11-14 – 2021-11-15 (×2): 200 mg via ORAL
  Filled 2021-11-13 (×2): qty 1

## 2021-11-13 MED ORDER — FENTANYL CITRATE (PF) 100 MCG/2ML IJ SOLN
INTRAMUSCULAR | Status: AC
  Filled 2021-11-13: qty 2

## 2021-11-13 MED ORDER — DEXAMETHASONE SODIUM PHOSPHATE 4 MG/ML IJ SOLN
4.0000 mg | Freq: Two times a day (BID) | INTRAMUSCULAR | Status: DC
  Administered 2021-11-13 – 2021-11-15 (×4): 4 mg via INTRAVENOUS
  Filled 2021-11-13 (×4): qty 1

## 2021-11-13 MED ORDER — TRAMADOL HCL 50 MG PO TABS: 50.0000 mg | ORAL_TABLET | Freq: Four times a day (QID) | ORAL | Status: DC | PRN

## 2021-11-13 MED ORDER — ALBUTEROL SULFATE HFA 108 (90 BASE) MCG/ACT IN AERS: 1.0000 | INHALATION_SPRAY | RESPIRATORY_TRACT | Status: DC | PRN

## 2021-11-13 MED ORDER — SUGAMMADEX SODIUM 200 MG/2ML IV SOLN
INTRAVENOUS | Status: DC | PRN
  Administered 2021-11-13: 200 mg via INTRAVENOUS

## 2021-11-13 MED ORDER — GABAPENTIN 300 MG PO CAPS
300.0000 mg | ORAL_CAPSULE | ORAL | Status: AC
  Administered 2021-11-13: 300 mg via ORAL
  Filled 2021-11-13: qty 1

## 2021-11-13 MED ORDER — LIDOCAINE 2% (20 MG/ML) 5 ML SYRINGE
INTRAMUSCULAR | Status: DC | PRN
  Administered 2021-11-13: 1.5 mg/kg/h via INTRAVENOUS
  Administered 2021-11-13: 60 mg via INTRAVENOUS

## 2021-11-13 MED ORDER — ALPRAZOLAM 0.25 MG PO TABS: 0.2500 mg | ORAL_TABLET | Freq: Three times a day (TID) | ORAL | Status: DC | PRN

## 2021-11-13 MED ORDER — BUPIVACAINE LIPOSOME 1.3 % IJ SUSP
INTRAMUSCULAR | Status: DC | PRN
  Administered 2021-11-13: 20 mL

## 2021-11-13 MED ORDER — ACETAMINOPHEN 500 MG PO TABS
1000.0000 mg | ORAL_TABLET | Freq: Four times a day (QID) | ORAL | Status: DC
  Administered 2021-11-13 – 2021-11-14 (×5): 1000 mg via ORAL
  Filled 2021-11-13 (×5): qty 2

## 2021-11-13 MED ORDER — ENSURE PRE-SURGERY PO LIQD: 592.0000 mL | Freq: Once | ORAL | Status: DC

## 2021-11-13 MED ORDER — PHENYLEPHRINE HCL-NACL 20-0.9 MG/250ML-% IV SOLN
INTRAVENOUS | Status: DC | PRN
  Administered 2021-11-13: 30 ug/min via INTRAVENOUS

## 2021-11-13 MED ORDER — ONDANSETRON HCL 4 MG/2ML IJ SOLN
INTRAMUSCULAR | Status: AC
  Filled 2021-11-13: qty 2

## 2021-11-13 MED ORDER — PRIMIDONE 50 MG PO TABS
100.0000 mg | ORAL_TABLET | Freq: Every day | ORAL | Status: DC
  Administered 2021-11-13 – 2021-11-14 (×2): 100 mg via ORAL
  Filled 2021-11-13 (×2): qty 2

## 2021-11-13 MED ORDER — MAGNESIUM HYDROXIDE 400 MG/5ML PO SUSP: 30.0000 mL | Freq: Every day | ORAL | Status: DC | PRN

## 2021-11-13 MED ORDER — LIP MEDEX EX OINT
TOPICAL_OINTMENT | Freq: Two times a day (BID) | CUTANEOUS | Status: DC
  Administered 2021-11-13 – 2021-11-14 (×2): 75 via TOPICAL
  Administered 2021-11-14: 1 via TOPICAL
  Filled 2021-11-13 (×2): qty 7

## 2021-11-13 MED ORDER — DIPHENHYDRAMINE HCL 12.5 MG/5ML PO ELIX: 12.5000 mg | ORAL_SOLUTION | Freq: Four times a day (QID) | ORAL | Status: DC | PRN

## 2021-11-13 MED ORDER — LATANOPROST 0.005 % OP SOLN
1.0000 [drp] | Freq: Every day | OPHTHALMIC | Status: DC
  Administered 2021-11-13 – 2021-11-14 (×2): 1 [drp] via OPHTHALMIC
  Filled 2021-11-13: qty 2.5

## 2021-11-13 MED ORDER — BUPIVACAINE LIPOSOME 1.3 % IJ SUSP
INTRAMUSCULAR | Status: AC
  Filled 2021-11-13: qty 20

## 2021-11-13 MED ORDER — PROCHLORPERAZINE EDISYLATE 10 MG/2ML IJ SOLN: 5.0000 mg | Freq: Four times a day (QID) | INTRAMUSCULAR | Status: DC | PRN

## 2021-11-13 MED ORDER — METOCLOPRAMIDE HCL 5 MG/ML IJ SOLN: 5.0000 mg | Freq: Three times a day (TID) | INTRAMUSCULAR | Status: DC | PRN

## 2021-11-13 MED ORDER — GABAPENTIN 400 MG PO CAPS
800.0000 mg | ORAL_CAPSULE | Freq: Every day | ORAL | Status: DC
  Administered 2021-11-13 – 2021-11-14 (×2): 800 mg via ORAL
  Filled 2021-11-13 (×2): qty 2

## 2021-11-13 MED ORDER — LACTATED RINGERS IV SOLN: INTRAVENOUS | Status: AC

## 2021-11-13 MED ORDER — MAGIC MOUTHWASH: 15.0000 mL | Freq: Four times a day (QID) | ORAL | Status: DC | PRN

## 2021-11-13 MED ORDER — ROCURONIUM BROMIDE 10 MG/ML (PF) SYRINGE
PREFILLED_SYRINGE | INTRAVENOUS | Status: AC
  Filled 2021-11-13: qty 10

## 2021-11-13 MED ORDER — 0.9 % SODIUM CHLORIDE (POUR BTL) OPTIME
TOPICAL | Status: DC | PRN
  Administered 2021-11-13: 1000 mL

## 2021-11-13 SURGICAL SUPPLY — 66 items
APPLICATOR COTTON TIP 6 STRL (MISCELLANEOUS) ×1 IMPLANT
APPLICATOR COTTON TIP 6IN STRL (MISCELLANEOUS) ×2
APPLIER CLIP 5 13 M/L LIGAMAX5 (MISCELLANEOUS)
BAG COUNTER SPONGE SURGICOUNT (BAG) ×2 IMPLANT
BLADE SURG SZ11 CARB STEEL (BLADE) ×2 IMPLANT
CHLORAPREP W/TINT 26 (MISCELLANEOUS) ×2 IMPLANT
CLIP APPLIE 5 13 M/L LIGAMAX5 (MISCELLANEOUS) IMPLANT
COVER SURGICAL LIGHT HANDLE (MISCELLANEOUS) ×2 IMPLANT
COVER TIP SHEARS 8 DVNC (MISCELLANEOUS) IMPLANT
COVER TIP SHEARS 8MM DA VINCI (MISCELLANEOUS)
DRAIN CHANNEL 19F RND (DRAIN) ×1 IMPLANT
DRAIN PENROSE 0.5X18 (DRAIN) IMPLANT
DRAPE ARM DVNC X/XI (DISPOSABLE) ×4 IMPLANT
DRAPE COLUMN DVNC XI (DISPOSABLE) ×1 IMPLANT
DRAPE DA VINCI XI ARM (DISPOSABLE) ×8
DRAPE DA VINCI XI COLUMN (DISPOSABLE) ×2
DRAPE WARM FLUID 44X44 (DRAPES) ×2 IMPLANT
DRSG TEGADERM 2-3/8X2-3/4 SM (GAUZE/BANDAGES/DRESSINGS) ×10 IMPLANT
ELECT REM PT RETURN 15FT ADLT (MISCELLANEOUS) ×2 IMPLANT
ENDOLOOP SUT PDS II  0 18 (SUTURE)
ENDOLOOP SUT PDS II 0 18 (SUTURE) IMPLANT
EVACUATOR DRAINAGE 10X20 100CC (DRAIN) IMPLANT
EVACUATOR SILICONE 100CC (DRAIN) ×1 IMPLANT
FELT TEFLON 4 X1 (Mesh General) ×2 IMPLANT
GAUZE SPONGE 2X2 8PLY STRL LF (GAUZE/BANDAGES/DRESSINGS) ×1 IMPLANT
GLOVE ECLIPSE 8.0 STRL XLNG CF (GLOVE) ×4 IMPLANT
GLOVE INDICATOR 8.0 STRL GRN (GLOVE) ×4 IMPLANT
GOWN STRL REUS W/ TWL XL LVL3 (GOWN DISPOSABLE) ×4 IMPLANT
GOWN STRL REUS W/TWL XL LVL3 (GOWN DISPOSABLE) ×8
GRASPER SUT TROCAR 14GX15 (MISCELLANEOUS) IMPLANT
IRRIG SUCT STRYKERFLOW 2 WTIP (MISCELLANEOUS) ×2
IRRIGATION SUCT STRKRFLW 2 WTP (MISCELLANEOUS) ×1 IMPLANT
KIT BASIN OR (CUSTOM PROCEDURE TRAY) ×2 IMPLANT
KIT TURNOVER KIT A (KITS) IMPLANT
MESH PHASIX RESORB RECT 10X15 (Mesh General) ×1 IMPLANT
NEEDLE HYPO 22GX1.5 SAFETY (NEEDLE) ×2 IMPLANT
PACK CARDIOVASCULAR III (CUSTOM PROCEDURE TRAY) ×2 IMPLANT
PAD POSITIONING PINK XL (MISCELLANEOUS) ×2 IMPLANT
PENCIL SMOKE EVACUATOR (MISCELLANEOUS) IMPLANT
SCISSORS LAP 5X45 EPIX DISP (ENDOMECHANICALS) ×1 IMPLANT
SEAL CANN UNIV 5-8 DVNC XI (MISCELLANEOUS) ×4 IMPLANT
SEAL XI 5MM-8MM UNIVERSAL (MISCELLANEOUS) ×8
SEALER VESSEL DA VINCI XI (MISCELLANEOUS) ×2
SEALER VESSEL EXT DVNC XI (MISCELLANEOUS) ×1 IMPLANT
SOLUTION ELECTROLUBE (MISCELLANEOUS) ×2 IMPLANT
SPIKE FLUID TRANSFER (MISCELLANEOUS) ×2 IMPLANT
SPONGE GAUZE 2X2 STER 10/PKG (GAUZE/BANDAGES/DRESSINGS) ×2
STOPCOCK 4 WAY LG BORE MALE ST (IV SETS) ×4 IMPLANT
SUT ETHIBOND 0 36 GRN (SUTURE) ×4 IMPLANT
SUT ETHIBOND NAB CT1 #1 30IN (SUTURE) ×6 IMPLANT
SUT MNCRL AB 4-0 PS2 18 (SUTURE) ×2 IMPLANT
SUT PROLENE 2 0 SH DA (SUTURE) ×1 IMPLANT
SUT VICRYL 0 TIES 12 18 (SUTURE) IMPLANT
SUT VICRYL 0 UR6 27IN ABS (SUTURE) IMPLANT
SUT VLOC 180 0 6IN GS21 (SUTURE) ×1 IMPLANT
SUT VLOC 180 0 9IN  GS21 (SUTURE) ×2
SUT VLOC 180 0 9IN GS21 (SUTURE) IMPLANT
SUT VLOC 180 2-0 9IN GS21 (SUTURE) IMPLANT
SYR 10ML LL (SYRINGE) ×2 IMPLANT
SYR 20ML LL LF (SYRINGE) ×2 IMPLANT
TOWEL OR 17X26 10 PK STRL BLUE (TOWEL DISPOSABLE) ×2 IMPLANT
TOWEL OR NON WOVEN STRL DISP B (DISPOSABLE) ×2 IMPLANT
TRAY FOLEY MTR SLVR 14FR STAT (SET/KITS/TRAYS/PACK) IMPLANT
TRAY FOLEY MTR SLVR 16FR STAT (SET/KITS/TRAYS/PACK) ×1 IMPLANT
TROCAR ADV FIXATION 5X100MM (TROCAR) ×2 IMPLANT
TUBING INSUFFLATION 10FT LAP (TUBING) ×2 IMPLANT

## 2021-11-13 NOTE — Op Note (Signed)
11/13/2021  4:40 PM  PATIENT:  Christy Jenkins  71 y.o. female  Patient Care Team: Robyne Peers, MD as PCP - General (Family Medicine) Berniece Salines, DO as PCP - Cardiology (Cardiology) Debara Pickett Nadean Corwin, MD as Consulting Physician (Cardiology) Lavena Bullion, DO as Consulting Physician (Gastroenterology) Michael Boston, MD as Consulting Physician (General Surgery)  PRE-OPERATIVE DIAGNOSIS:  PARAESOPHAGEAL HIATAL HERNIA   POST-OPERATIVE DIAGNOSIS:  PARAESOPHAGEAL HIATAL HERNIA   PROCEDURE:    1. ROBOTIC reduction of paraesophageal hiatal hernia 2. Type II mediastinal dissection. 3. Primary repair of hiatal hernia over pledgets.  4. Anterior & posterior gastropexy. 5. Toupet (partial posterior 253 deg) fundoplication  6. Mesh reinforcement with absorbable mesh  SURGEON:  Adin Hector, MD  ASSIST:  Leighton Ruff, MD, FACS, FASCRS An experienced assistant was required given the standard of surgical care given the complexity of the case.  This assistant was needed for exposure, dissection, suction, tissue approximation, retraction, perception, etc.  ANESTHESIA:   local and general  EBL:  Total I/O In: 400 [I.V.:200; IV Piggyback:200] Out: 800 [Urine:750; Blood:50]  Delay start of Pharmacological VTE agent (>24hrs) due to surgical blood loss or risk of bleeding:  no  ANESTHESIA: 1. General anesthesia. 2. Local anesthetic in a field block around all port sites.  SPECIMEN:  Mediastinal hernia sac (not sent).  DRAINS:  A 19-French Blake drain goes from the right upper quadrant along the lesser curvature of the stomach into the mediastinum.  COUNTS:  YES  PLAN OF CARE: Admit for overnight observation  PATIENT DISPOSITION:  PACU - hemodynamically stable.  INDICATION:   Patient with symptomatic paraesophageal hiatal hernia.  The patient has had extensive work-up & we feel the patient will benefit from repair:  The anatomy & physiology of the foregut and  anti-reflux mechanism was discussed.  The pathophysiology of hiatal herniation and GERD was discussed.  Natural history risks without surgery was discussed.   The patient's symptoms are not adequately controlled by medicines and other non-operative treatments.  I feel the risks of no intervention will lead to serious problems that outweigh the operative risks; therefore, I recommended surgery to reduce the hiatal hernia out of the chest and fundoplication to rebuild the anti-reflux valve and control reflux better.  Need for a thorough workup to rule out the differential diagnosis and plan treatment was explained.  I explained laparoscopic techniques with possible need for an open approach.  Risks such as bleeding, infection, abscess, leak, need for further treatment, heart attack, death, and other risks were discussed.   I noted a good likelihood this will help address the problem.  Goals of post-operative recovery were discussed as well.  Possibility that this will not correct all symptoms was explained.  Post-operative dysphagia, need for short-term liquid & pureed diet, inability to vomit, possibility of reherniation, possible need for medicines to help control symptoms in addition to surgery were discussed.  We will work to minimize complications.   Educational handouts further explaining the pathology, treatment options, and dysphagia diet was given as well.  Questions were answered.  The patient expresses understanding & wishes to proceed with surgery.  OR FINDINGS:   Moderate-sized paraesophageal hiatal hernia with 20% of the stomach in the mediastinum.  There was a 7 x 6 cm hiatal defect.  It is a primary repair over pledgets. Mesh reinforcement was used with Mesh was used: PhasixT Mesh (a knitted monofilament mesh scaffold using Poly-4-hydroxybutyrate (P4HB), a biologically derived, fully resorbable material)  The patient has a 4 cm partial posterior Toupet fundoplication x 4cm length.  The  patient has had anterior and posterior gastropexies.  DESCRIPTION:   Informed consent was confirmed.  The patient underwent general anaesthesia without difficulty.  The patient was positioned appropriately.  VTE prevention in place.  The patient's abdomen was clipped, prepped, & draped in a sterile fashion.  Surgical timeout confirmed our plan.  Peritoneal entry with a laparoscopic port was obtained using Varess spring needle entry technique in the left upper abdomen as the patient was positioned in reverse Trendelenburg.  I induced carbon dioxide insufflation.  No change in end tidal CO2 measurements.  Full symmetrical abdominal distention.  Initial port was carefully placed.  Camera inspection revealed no injury.  Extra ports were carefully placed under direct laparoscopic visualization.  I also placed a 5 mm port in the left subxiphoid region under direct visualization.  I removed that and placed an Omega-shaped rigid Nathanson liver retractor to lift the left lateral sector of the liver anteriorly to expose the esophageal hiatus.  This was secured to the bed using the iron man system.  The Xi robot was carefully docked and instruments placed and advanced under direct visualization.  We focused on dissection.  We did see the cardia stomach herniating up into a paraesophageal hiatal defect in the diaphragm.  We grasped the anterior mediastinal sac at the apex of the crus.  I scored through that and got into the anterior mediastinum.  I was able to free the mediastinal sac from its attachments to the pericardium and bilateral pleura using primarily focused gentle blunt dissection as well as focused vessel sealer dissection.  I transected phrenoesophageal attachments to the inner right crus, preserving a two centimeter cuff of mediastinal sac until I found the base of the crura.  I then came around anteriorly on the left side and freed up the phrenoesophageal attachments of the mediastinal sac on the medial  part of the left crus on the superior half.  I did careful mediastinal dissection to free the mediastinal sac.  With that, we could relieve the suction cup affect of the hernia sac and help reduce the stomach back down into the abdomen, flipped back approriately.    We ligated the short gastrics along the lesser curvature of the stomach about a third the way down and then came up proximally over the fundus.  We released the attachments of the stomach to the retroperitoneum until we were able to connect with the prior dissection on the left crus.  We completed the release of phrenoesophageal attachments to the medial part of the left crus down to its base.  With this, we had circumferential mobilization.    We placed the stomach and esophagus on axial tension.  I then did a Type II mediastinal dissection where I freed the esophagus from its attachments to the aorta, spine, pleura, and pericardium using primarily gentle blunt as well as focused ultrasonic dissection.  We saw the anterior & posterior vagus nerves intact.  We preserved it at all times.  I procedded to dissect about 15 cm proximally into the mediastinum.  With that I could straighten out the esophagus and get 5 cm of intra-abdominal length of the esophagus at a best estimation.  I freed the anterior mediastinal sac off the esophagus & stomach.  We saw the anterior vagus nerve and freed the sac off of the vagus.  I dissected out & removed the fatty  epiphrenic pads at  the esophagogastric junction. With that, I could better define the esophagogastric junction.  I confirmed the the patient had 5 cm of intra-abdominal esophageal length off tension.  I brought the fundus of the stomach posterior to the esophagus over to the right side.  The wrap was mobile with the classic shoe shine maneuver.  Wrap became together gently.  We reflected the stomach left laterally and closed the esophageal hiatus using 0 Ethibond stitch using horizontal mattress  stitches with pledgets on both sides.  I did that x3 stitches.  The crura had good substance and they came together well without any tension.  Because of her elevated BMI, I reinforced the repair using a 15 x 10 cm biologic Phasix mesh.  I cut out a 2x4 cm part of mesh in the middle third of the mesh such that the mesh had a broad U shape transversely, one tail 5 cm wide & the other 8cm.  We brought the mesh in and laid it over the crural repair, tails anterior over the crura.  I tucked the broader "U" tail of the mesh between the left diaphragm and the spleen, the narrower "U" tail over the right crus.  I then secured the posterior & anterior corners of the narrower"U" tail with 0 Ethibond upper interrupted suture to the right crus.  I secured to the left lateral and left superior sides of the broader "U" tail to the left diaphragm band with 2-0 V lock running suture.   I brought the fundus of the stomach behind the esophagus and cardia to set up a fundoplication wrap.  I did a posterior gastropexy by taking of #1 Ethibond stitches to the posterior part of the right side of the wrap and thru the Phasix mesh and crural closure x2.  I placed a 0 Ethibond stitch on the left anterior crura, mesh, and left superior side of the wrap and tied that down for her left anterior gastropexy.  I did a similar stitch on the right superior side of the wrap to occur in a mirror image fashion.  That way the stomach covered the mesh and protected it from any esophageal exposure.  With the anterior and posterior gastropexies, stomach laid well for a fundoplication wrap.   I then did a classic 4 cm Toupet fundoplication on the true esophagus above the cardia using Ethibond stitch in the left side of the wrap, then left anterior esophagus and tied that down.  Then and in a similar stitch on right side of the wrap and tied that down. Did 3 pairs of stitches.  I measured it and it was 4 cm in length.  The wrap was soft and floppy.   The head closure was snug but I could easily pass a large retractor through anterior to the esophagus at the apex of the hiatus without difficulty.  We removed needles and suture and epiphrenic pad pants.  Count was correct.  I placed a drain as noted above.  I did irrigation and ensured hemostasis.  I saw no evidence of any leak or perforation or other abnormality.   We docked the robot and did laparoscopic inspection.  I removed the [facility] liver retractor under direct visualization.  I evacuated carbon dioxide and removed the ports.  Blake drain secured to skin using Prolene suture.  The skin was closed with Monocryl and sterile dressings applied.  The patient is being extubated and brought back to the recovery room.  I discussed postop care in detail  with the patient in in the office.  Discussed again with the patient in the holding area.  I discussed operative findings, updated the patient's status, discussed probable steps to recovery, and gave postoperative recommendations to the patient's daughter, Clearnce Sorrel .  Recommendations were made.  Questions were answered.  She expressed understanding & appreciation.  Adin Hector, M.D., F.A.C.S. Gastrointestinal and Minimally Invasive Surgery [facility] [address] [place], [place] [address] [phone] Main / Paging

## 2021-11-13 NOTE — H&P (Signed)
11/13/2021     REFERRING PHYSICIAN: Gerrit Heck, DO  Patient Care Team: Harriet Masson, MD as PCP - General (Family Medicine) Gerrit Heck, DO (Gastroenterology)  PROVIDER: Hollace Kinnier, MD  [facility] MRN: [id number] DOB: [birth year]  SUBJECTIVE   Chief Complaint: New Patient (Hiatal hernia )   History of Present Illness: Christy Jenkins is a 71 y.o. female who is seen today  as an office consultation at the request of Dr. Bryan Lemma  for evaluation of New Patient (Hiatal hernia ) .   Pleasant woman who struggled with heartburn and reflux for many many years. Usually get treated with over-the-counter pain medications. However started to need proton pump inhibitors. Is gradually worsened. She sits up in a recliner. She takes double to protonic. She avoids spicy foods and does smaller meals. However she often wakes up feeling of O'Connell burning in the back of her throat with reflux. Brackish fluid. Has belching and burping. Gets full and bloated. Does not necessarily feel food sticking when she swallows but sometimes it sits. She has been followed by [facility] gastroenterology. More recently Dr. Bryan Lemma. Underwent endoscopy which confirmed a moderate size hiatal hernia. Some esophagitis and irritation. Had manometry which showed 70% decent esophageal function but not totally normal. No severe achalasia like dysmotility. Decreased LES. Giving her worsening symptoms he recommended considering some type of procedure to fix his hiatal hernia and fundoplication. He tends to like to TIF by EGD. Normally patient sent to Dr. Redmond Pulling has a combined procedure but patient did up seeing me. She was fine seeing me first.  Patient does struggle with numerous health issues including some chronic pain anxiety and hypertension. She has had hysterectomy and something oophorectomy. She had an open cholecystectomy. Had an open primary repair of an incisional hernia. No mesh. She does have  chronic constipation and moves her bowels about once a week. She does suffer with hypercholesterolemia. Had a lot of problems with statins with a lot of muscle and body aches. On a different regimen followed by Dr. Debara Pickett. Her usual cardiologist of believe is Dr. Jorene Minors. She does not smoke for the past 20 years. Denies sleep apnea.  Medical History:  Past Medical History:  Diagnosis Date  Anxiety  Arthritis  Asthma, unspecified asthma severity, unspecified whether complicated, unspecified whether persistent  GERD (gastroesophageal reflux disease)  Glaucoma (increased eye pressure)  Hyperlipidemia  Hypertension   Patient Active Problem List  Diagnosis  Abdominal bloating  Hiatal hernia with gastroesophageal reflux  BMI 35.0-35.9,adult  Eructation  Chronic constipation   Past Surgical History:  Procedure Laterality Date  right thumb arthroplasty 2014  CHOLECYSTECTOMY    Allergies  Allergen Reactions  Atorvastatin Unknown   Current Outpatient Medications on File Prior to Visit  Medication Sig Dispense Refill  aspirin 81 MG EC tablet Take by mouth  escitalopram oxalate (LEXAPRO) 20 MG tablet Take 20 mg by mouth once daily  fluticasone propion-salmeteroL (ADVAIR DISKUS) 100-50 mcg/dose diskus inhaler Inhale into the lungs  fluticasone propion-salmeteroL (ADVAIR DISKUS) 100-50 mcg/dose diskus inhaler Inhale into the lungs  gabapentin (NEURONTIN) 800 MG tablet Take by mouth  latanoprost (XALATAN) 0.005 % ophthalmic solution  methocarbamoL (ROBAXIN) 500 MG tablet Take 500 mg by mouth  primidone (MYSOLINE) 50 MG tablet Take by mouth at bedtime  REPATHA SURECLICK 244 mg/mL PnIj Inject subcutaneously  tiZANidine (ZANAFLEX) 2 MG tablet Take 1-2 tabs twice daily as needed for pain, insomnia.  zonisamide (ZONEGRAN) 100 MG capsule at bedtime as needed.  cholecalciferol (VITAMIN  D3) 1000 unit tablet Take by mouth   No current facility-administered medications on file prior to visit.    Family History  Problem Relation Age of Onset  Stroke Mother  Skin cancer Mother  High blood pressure (Hypertension) Mother  Hyperlipidemia (Elevated cholesterol) Mother  Coronary Artery Disease (Blocked arteries around heart) Mother  Diabetes Father  Obesity Father  High blood pressure (Hypertension) Father  Hyperlipidemia (Elevated cholesterol) Father  Coronary Artery Disease (Blocked arteries around heart) Father  Obesity Brother  High blood pressure (Hypertension) Brother  Hyperlipidemia (Elevated cholesterol) Brother  Diabetes Brother    Social History   Tobacco Use  Smoking Status Former  Types: Cigarettes  Quit date: 2000  Years since quitting: 23.5  Smokeless Tobacco Former    Social History   Socioeconomic History  Marital status: Single  Tobacco Use  Smoking status: Former  Types: Cigarettes  Quit date: 2000  Years since quitting: 23.5  Smokeless tobacco: Former  Substance and Sexual Activity  Alcohol use: Never  Drug use: Never   ############################################################  Review of Systems: A complete review of systems (ROS) was obtained from the patient. I have reviewed this information and discussed as appropriate with the patient. See HPI as well for other pertinent ROS.  Constitutional: No fevers, chills, sweats. Weight stable Eyes: No vision changes, No discharge HENT: No sore throats, nasal drainage Lymph: No neck swelling, No bruising easily Pulmonary: No cough, productive sputum CV: No orthopnea, PND . No exertional chest/neck/shoulder/arm pain. Patient can walk 20 minutes gradually.   GI: No personal nor family history of GI/colon cancer, inflammatory bowel disease, irritable bowel syndrome, allergy such as Celiac Sprue, dietary/dairy problems, colitis, ulcers nor gastritis. No recent sick contacts/gastroenteritis. No travel outside the country. No changes in diet.  Renal: No UTIs, No hematuria Genital: No drainage,  bleeding, masses Musculoskeletal: No severe joint pain. Good ROM major joints Skin: No sores or lesions Heme/Lymph: No easy bleeding. No swollen lymph nodes Neuro: No active seizures. No facial droop Psych: No hallucinations. No agitation  OBJECTIVE   Vitals:  10/07/21 1449  BP: 122/68  Pulse: 82  Temp: 36.6 C (97.8 F)  SpO2: 98%  Weight: 90.9 kg (200 lb 6.4 oz)  Height: 160 cm ('5\' 3"'$ )   Body mass index is 35.5 kg/m.  PHYSICAL EXAM:  Constitutional: Not cachectic. Hygeine adequate. Vitals signs as above.  Eyes: No glasses. Vision adequate,Pupils reactive, normal extraocular movements. Sclera nonicteric Neuro: CN II-XII intact. No major focal sensory defects. No major motor deficits. Lymph: No head/neck/groin lymphadenopathy Psych: No severe agitation. No severe anxiety. Judgment & insight Adequate, Oriented x4, HENT: Normocephalic, Mucus membranes moist. No thrush. Hearing: adequate Neck: Supple, No tracheal deviation. No obvious thyromegaly Chest: No pain to chest wall compression. Good respiratory excursion. No audible wheezing CV: Pulses intact. regular. No major extremity edema Ext: No obvious deformity or contracture. Edema: Not present. No cyanosis Skin: No major subcutaneous nodules. Warm and dry Musculoskeletal: Severe joint rigidity not present. No obvious clubbing. No digital petechiae. Mobility: no assist device moving easily without restrictions  Abdomen: Obese Soft. Nondistended. Tenderness at epigastric region only. Marland Kitchen Hernia: Not present. Diastasis recti: Not present. No hepatomegaly. No splenomegaly.  Genital/Pelvic: Inguinal hernia: Not present. Inguinal lymph nodes: without lymphadenopathy nor hidradenitis.   Rectal: (Deferred)    ###################################################################  Labs, Imaging and Diagnostic Testing:  Located in 'Care Everywhere' section of Epic EMR chart  PRIOR [facility] CLINIC NOTES:  Not applicable  SURGERY  NOTES:  Not applicable  PATHOLOGY:  Not applicable  Assessment and Plan:  DIAGNOSES:  Diagnoses and all orders for this visit:  Hiatal hernia with gastroesophageal reflux  Abdominal bloating  BMI 35.0-35.9,adult  Eructation  Chronic constipation    ASSESSMENT/PLAN  Pleasant woman with moderate size hiatal hernia with severe heartburn and reflux despite diet, position, medication adjustments. Followed by GI  My instinct is that she would benefit from hiatal hernia reduction and reconstruction of heartburn valve. Given some esophageal dysmotility by upper GI and especially manometry, I do not know if she can tolerate a full Nissen fundoplication. I tend to hold off on that on older folks anyway. =  partial Toupet fundoplication.   Given her obesity I worry about hiatal hernia recurrence and usually do an absorbable Phasix mesh reinforcement of the hiatal closure. We will have to see how big the defect is and if that is warranted or not -often an intraoperative decision. She is not interested in considering Roux-en-Y reconstruction / gastric bypass which is standard considering BMI is above 35.  Normal; GES noted  The anatomy & physiology of the foregut and anti-reflux mechanism was discussed.  The pathophysiology of hiatal herniation and GERD was discussed.  Natural history risks without surgery was discussed.   The patient's symptoms are not adequately controlled by medicines and other non-operative treatments.  I feel the risks of no intervention will lead to serious problems that outweigh the operative risks; therefore, I recommended surgery to reduce the hiatal hernia out of the chest and fundoplication to rebuild the anti-reflux valve and control reflux better.  Need for a thorough workup to rule out the differential diagnosis and plan treatment was explained.  I explained minimally invasive techniques with possible need for an open approach.  Risks such as bleeding,  infection, abscess, leak,injury to other organs, need for repair of tissues / organs, need for further treatment, stroke, heart attack, death, and other risks were discussed.   I noted a good likelihood this will help address the problem.  Goals of post-operative recovery were discussed as well.  Possibility that this will not correct all symptoms was explained.  Post-operative dysphagia, need for short-term liquid & pureed diet, inability to vomit, possibility of reherniation, possible need for medicines to help control symptoms in addition to surgery were discussed.  We will work to minimize complications.   Educational handouts further explaining the pathology, treatment options, and dysphagia diet was given as well.  Questions were answered.  The patient expresses understanding & wishes to proceed with surgery.    She has pretty good performance status and decent echocardiogram but is followed by cardiology for hypercholesteremia and other issues including coronary disease. Double check with Dr. Harriet Masson that she does not feel there is any concerns in considering surgery.   Adin Hector, MD, FACS, MASCRS Esophageal, Gastrointestinal & Colorectal Surgery Robotic and Minimally Invasive Surgery  [facility] [address], [place], [place] [address] [phone] Fax [phone] Main  CONTACT INFORMATION:  Weekday (9AM-5PM): Call [facility] main office at [phone]  Weeknight (5PM-9AM) or Weekend/Holiday: Check [email or web address] (password " [email or web address]") for General Surgery [facility] coverage  (Please, do not use SecureChat as it is not reliable communication to reach operating surgeons for immediate patient care)    11/13/2021

## 2021-11-13 NOTE — Discharge Instructions (Addendum)
EATING AFTER YOUR ESOPHAGEAL SURGERY (Stomach Fundoplication, Hiatal Hernia repair, Achalasia surgery, etc)  ######################################################################  EAT Start with a pureed / full liquid diet (see below) Gradually transition to a high fiber diet with a fiber supplement over the next month after discharge.    WALK Walk an hour a day.  Control your pain to do that.    CONTROL PAIN Control pain so that you can walk, sleep, tolerate sneezing/coughing, go up/down stairs.  HAVE A BOWEL MOVEMENT DAILY Keep your bowels regular to avoid problems.  OK to try a laxative to override constipation.  OK to use an antidairrheal to slow down diarrhea.  Call if not better after 2 tries  CALL IF YOU HAVE PROBLEMS/CONCERNS Call if you are still struggling despite following these instructions. Call if you have concerns not answered by these instructions  ######################################################################   After your esophageal surgery, expect some sticking with swallowing over the next 1-2 months.    If food sticks when you eat, it is called "dysphagia".  This is due to swelling around your esophagus at the wrap & hiatal diaphragm repair.  It will gradually ease off over the next few months.  To help you through this temporary phase, we start you out on a pureed (blenderized) diet.  Your first meal in the hospital was thin liquids.  You should have been given a pureed diet by the time you left the hospital.  We ask patients to stay on a pureed diet for the first 2-3 weeks to avoid anything getting "stuck" near your recent surgery.  Don't be alarmed if your ability to swallow doesn't progress according to this plan.  Everyone is different and some diets can advance more or less quickly.    It is often helpful to crush your medications or split them as they can sometimes stick, especially the first week or so.   Some BASIC RULES to follow  are:  Maintain an upright position whenever eating or drinking.  Take small bites - just a teaspoon size bite at a time.  Eat slowly.  It may also help to eat only one food at a time.  Consider nibbling through smaller, more frequent meals & avoid the urge to eat BIG meals  Do not push through feelings of fullness, nausea, or bloatedness  Do not mix solid foods and liquids in the same mouthful  Try not to "wash foods down" with large gulps of liquids.  Avoid carbonated (bubbly/fizzy) drinks.    Avoid foods that make you feel gassy or bloated.  Start with bland foods first.  Wait on trying greasy, fried, or spicy meals until you are tolerating more bland solids well.  Understand that it will be hard to burp and belch at first.  This gradually improves with time.  Expect to be more gassy/flatulent/bloated initially.  Walking will help your body manage it better.  Consider using medications for bloating that contain simethicone such as  Maalox or Gas-X   Consider crushing her medications, especially smaller pills.  The ability to swallow pills should get easier after a few weeks  Eat in a relaxed atmosphere & minimize distractions.  Avoid talking while eating.    Do not use straws.  Following each meal, sit in an upright position (90 degree angle) for 60 to 90 minutes.  Going for a short walk can help as well  If food does stick, don't panic.  Try to relax and let the food pass on its own.    Be gradual in changes & use common sense:  -If you easily tolerating a certain "level" of foods, advance to the next level gradually -If you are having trouble swallowing a particular food, then avoid it.   -If food is sticking when you advance your diet, go back to thinner previous diet (the lower LEVEL) for 1-2 days.  LEVEL 1 = PUREED DIET  Do for the first 2 WEEKS AFTER SURGERY  -Foods in this group are pureed or blenderized to a smooth, mashed  potato-like consistency.  -If necessary, the pureed foods can keep their shape with the addition of a thickening agent.   -Meat should be pureed to a smooth, pasty consistency.  Hot broth or gravy may be added to the pureed meat, approximately 1 oz. of liquid per 3 oz. serving of meat. -CAUTION:  If any foods do not puree into a smooth consistency, swallowing will be more difficult.  (For example, nuts or seeds sometimes do not blend well.)  Hot Foods Cold Foods  Pureed scrambled eggs and cheese Pureed cottage cheese  Baby cereals Thickened juices and nectars  Thinned cooked cereals (no lumps) Thickened milk or eggnog  Pureed [place] toast or pancakes Ensure  Mashed potatoes Ice cream  Pureed parsley, au gratin, scalloped potatoes, candied sweet potatoes Fruit or [place] ice, sherbet  Pureed buttered or alfredo noodles Plain yogurt  Pureed vegetables (no corn or peas) Instant breakfast  Pureed soups and creamed soups Smooth pudding, mousse, custard  Pureed scalloped apples Whipped gelatin  Gravies Sugar, syrup, honey, jelly  Sauces, cheese, tomato, barbecue, white, creamed Cream  Any baby food Creamer  Alcohol in moderation (not beer or champagne) Margarine  Coffee or tea Mayonnaise   Ketchup, mustard   Apple sauce   SAMPLE MENU:  PUREED DIET Breakfast Lunch Dinner  Orange juice, 1/2 cup Cream of wheat, 1/2 cup Pineapple juice, 1/2 cup Pureed [place], barley soup, 3/4 cup Pureed Hawaiian chicken, 3 oz  Scrambled eggs, mashed or blended with cheese, 1/2 cup Tea or coffee, 1 cup  Whole milk, 1 cup  Non-dairy creamer, 2 Tbsp. Mashed potatoes, 1/2 cup Pureed cooled broccoli, 1/2 cup Apple sauce, 1/2 cup Coffee or tea Mashed potatoes, 1/2 cup Pureed spinach, 1/2 cup Frozen yogurt, 1/2 cup Tea or coffee      LEVEL 2 = SOFT DIET  After your first 2 weeks, you can advance to a soft diet.   Keep on this diet until everything goes down easily.  Hot Foods Cold Foods  White fish  Cottage cheese  Stuffed fish Junior baby fruit  Baby food meals Semi thickened juices  Minced soft cooked, scrambled, poached eggs nectars  Souffle & omelets Ripe mashed bananas  Cooked cereals Canned fruit, pineapple sauce, milk  potatoes Milkshake  Buttered or Alfredo noodles Custard  Cooked cooled vegetable Puddings, including tapioca  Sherbet Yogurt  Vegetable soup or alphabet soup Fruit ice, [place] ice  Gravies Whipped gelatin  Sugar, syrup, honey, jelly Junior baby desserts  Sauces:  Cheese, creamed, barbecue, tomato, white Cream  Coffee or tea Margarine   SAMPLE MENU:  LEVEL 2 Breakfast Lunch Dinner  Orange juice, 1/2 cup Oatmeal, 1/2 cup Scrambled eggs with cheese, 1/2 cup Decaffeinated tea, 1 cup Whole milk, 1 cup Non-dairy creamer, 2 Tbsp Pineapple juice, 1/2 cup Minced beef, 3 oz Gravy, 2 Tbsp Mashed potatoes, 1/2 cup Minced fresh broccoli, 1/2 cup Applesauce, 1/2 cup Coffee, 1 cup [place], barley soup, 3/4 cup Minced Hawaiian chicken, 3 oz  Non-dairy creamer, 2 Tbsp  Pineapple juice, 1/2 cup  Minced beef, 3 oz  Gravy, 2 Tbsp  Mashed potatoes, 1/2 cup  Minced fresh broccoli, 1/2 cup  Applesauce, 1/2 cup  Coffee, 1 cup  [place], barley soup, 3/4 cup  Minced Hawaiian chicken, 3 oz  Mashed potatoes, 1/2 cup  Cooked spinach, 1/2 cup  Frozen yogurt, 1/2 cup  Non-dairy creamer, 2 Tbsp      LEVEL 3 = CHOPPED DIET  -After all the foods in level 2 (soft diet) are passing through well you should advance up to more chopped foods.  -It is still important to cut these foods into small pieces and eat slowly.  Hot Foods Cold Foods  Poultry Cottage cheese  Chopped Swedish meatballs Yogurt  Meat salads (ground or flaked meat) Milk  Flaked fish (tuna) Milkshakes  Poached or scrambled eggs Soft, cold, dry cereal  Souffles and omelets Fruit juices or nectars  Cooked cereals Chopped canned fruit  Chopped French toast or pancakes Canned fruit cocktail  Noodles or pasta (no rice) Pudding, mousse, custard  Cooked vegetables (no frozen peas, corn, or mixed vegetables) Green salad  Canned small sweet peas  Ice cream  Creamed soup or vegetable soup Fruit ice, Italian ice  Pureed vegetable soup or alphabet soup Non-dairy creamer  Ground scalloped apples Margarine  Gravies Mayonnaise  Sauces:  Cheese, creamed, barbecue, tomato, white Ketchup  Coffee or tea Mustard   SAMPLE MENU:  LEVEL 3 Breakfast Lunch Dinner   Orange juice, 1/2 cup  Oatmeal, 1/2 cup  Scrambled eggs with cheese, 1/2 cup  Decaffeinated tea, 1 cup  Whole milk, 1 cup  Non-dairy creamer, 2 Tbsp  Ketchup, 1 Tbsp  Margarine, 1 tsp  Salt, 1/4 tsp  Sugar, 2 tsp  Pineapple juice, 1/2 cup  Ground beef, 3 oz  Gravy, 2 Tbsp  Mashed potatoes, 1/2 cup  Cooked spinach, 1/2 cup  Applesauce, 1/2 cup  Decaffeinated coffee  Whole milk  Non-dairy creamer, 2 Tbsp  Margarine, 1 tsp  Salt, 1/4 tsp  Pureed turkey, barley soup, 3/4 cup  Barbecue chicken, 3 oz  Mashed potatoes, 1/2 cup  Ground fresh broccoli, 1/2 cup  Frozen yogurt, 1/2 cup  Decaffeinated tea, 1 cup  Non-dairy creamer, 2 Tbsp  Margarine, 1 tsp  Salt, 1/4 tsp  Sugar, 1 tsp    LEVEL 4:  REGULAR FOODS  -Foods in this group are soft, moist, regularly textured foods.   -This level includes meat and breads, which tend to be the hardest things to swallow.   -Eat very slowly, chew well and continue to avoid carbonated drinks. -most people are at this level in 4-6 weeks  Hot Foods Cold Foods  Baked fish or skinned Soft cheeses - cottage cheese  Souffles and omelets Cream cheese  Eggs Yogurt  Stuffed shells Milk  Spaghetti with meat sauce Milkshakes  Cooked cereal Cold dry cereals (no nuts, dried fruit, coconut)  French toast or pancakes Crackers  Buttered toast Fruit juices or nectars  Noodles or pasta (no rice) Canned fruit  Potatoes (all types) Ripe bananas  Soft, cooked vegetables (no corn, lima, or baked beans) Peeled, ripe, fresh fruit  Creamed soups or vegetable soup Cakes (no nuts, dried fruit, coconut)  Canned chicken  noodle soup Plain doughnuts  Gravies Ice cream  Bacon dressing Pudding, mousse, custard  Sauces:  Cheese, creamed, barbecue, tomato, white Fruit ice, Italian ice, sherbet  Decaffeinated tea or coffee Whipped gelatin  Pork chops Regular gelatin     as needed  6) May hold gluten/wheat products from diet to see if symptoms improve.  7) May try probiotics (Align, Activa, etc) to help calm the bowels down  7) If symptoms become worse call back immediately.    If you have any questions please call our office at [facility]: [phone].     ################################################################  LAPAROSCOPIC SURGERY: POST OP INSTRUCTIONS  ######################################################################  EAT Gradually transition to a high fiber diet with a fiber supplement over the next few weeks after discharge.  Start with a pureed / full liquid diet (see below)  WALK Walk an hour a day.  Control your pain to do that.    CONTROL PAIN Control pain so that you can walk, sleep, tolerate sneezing/coughing, go up/down stairs.  HAVE A BOWEL MOVEMENT DAILY Keep your bowels regular to avoid problems.  OK to try a laxative to override constipation.  OK to use an antidairrheal to slow down diarrhea.  Call if not better after 2 tries  CALL IF YOU HAVE PROBLEMS/CONCERNS Call if you are still struggling despite following these instructions. Call if you have concerns not answered by these instructions  ######################################################################    DIET: Follow a light bland diet & liquids the first 24 hours after arrival home, such as soup, liquids,  starches, etc.  Be sure to drink plenty of fluids.  Quickly advance to a usual solid diet within a few days.  Avoid fast food or heavy meals as your are more likely to get nauseated or have irregular bowels.  A low-fat, high-fiber diet for the rest of your life is ideal.  Take your usually prescribed home medications unless otherwise directed.  PAIN CONTROL: Pain is best controlled by a usual combination of three different methods TOGETHER: Ice/Heat Over the counter pain medication Prescription pain medication Most patients will experience some swelling and bruising around the incisions.  Ice packs or heating pads (30-60 minutes up to 6 times a day) will help. Use ice for the first few days to help decrease swelling and bruising, then switch to heat to help relax tight/sore spots and speed recovery.  Some people prefer to use ice alone, heat alone, alternating between ice & heat.  Experiment to what works for you.  Swelling and bruising can take several weeks to resolve.   It is helpful to take an over-the-counter pain medication regularly for the first few weeks.  Choose one of the following that works best for you: Naproxen (Aleve, etc)  Two 220mg tabs twice a day Ibuprofen (Advil, etc) Three 200mg tabs four times a day (every meal & bedtime) Acetaminophen (Tylenol, etc) 500-650mg four times a day (every meal & bedtime) A  prescription for pain medication (such as oxycodone, hydrocodone, tramadol, gabapentin, methocarbamol, etc) should be given to you upon discharge.  Take your pain medication as prescribed.  If you are having problems/concerns with the prescription medicine (does not control pain, nausea, vomiting, rash, itching, etc), please call us [phone] to see if we need to switch you to a different pain medicine that will work better for you and/or control your side effect better. If you need a refill on your pain medication, please give us 48 hour notice.  contact your pharmacy.   They will contact our office to request authorization. Prescriptions will not be filled after 5 pm or on week-ends  Avoid getting constipated.   Between the surgery and the pain medications, it is common to experience some constipation.   Increasing fluid   intake and taking a fiber supplement (such as Metamucil, Citrucel, FiberCon, MiraLax, etc) 1-2 times a day regularly will usually help prevent this problem from occurring.   A mild laxative (prune juice, Milk of Magnesia, MiraLax, etc) should be taken according to package directions if there are no bowel movements after 48 hours.   Watch out for diarrhea.   If you have many loose bowel movements, simplify your diet to bland foods & liquids for a few days.   Stop any stool softeners and decrease your fiber supplement.   Switching to mild anti-diarrheal medications (Kayopectate, Pepto Bismol) can help.   If this worsens or does not improve, please call us.  Wash / shower every day.  You may shower over the dressings as they are waterproof.  Continue to shower over incision(s) after the dressing is off.  It is good for closed incisions and even open wounds to be washed every day.  Shower every day.  Short baths are fine.  Wash the incisions and wounds clean with soap & water.    You may leave closed incisions open to air if it is dry.   You may cover the incision with clean gauze & replace it after your daily shower for comfort.  TEGADERM:  You have clear gauze band-aid dressings over your closed incision(s).  Remove the dressings 3 days after surgery.    ACTIVITIES as tolerated:   You may resume regular (light) daily activities beginning the next day--such as daily self-care, walking, climbing stairs--gradually increasing activities as tolerated.  If you can walk 30 minutes without difficulty, it is safe to try more intense activity such as jogging, treadmill, bicycling, low-impact aerobics, swimming, etc. Save the most intensive and strenuous  activity for last such as sit-ups, heavy lifting, contact sports, etc  Refrain from any heavy lifting or straining until you are off narcotics for pain control.   DO NOT PUSH THROUGH PAIN.  Let pain be your guide: If it hurts to do something, don't do it.  Pain is your body warning you to avoid that activity for another week until the pain goes down. You may drive when you are no longer taking prescription pain medication, you can comfortably wear a seatbelt, and you can safely maneuver your car and apply brakes. You may have sexual intercourse when it is comfortable.  FOLLOW UP in our office Please call [facility] at [phone] to set up an appointment to see your surgeon in the office for a follow-up appointment approximately 2-3 weeks after your surgery. Make sure that you call for this appointment the day you arrive home to insure a convenient appointment time.  10. IF YOU HAVE DISABILITY OR FAMILY LEAVE FORMS, BRING THEM TO THE OFFICE FOR PROCESSING.  DO NOT GIVE THEM TO YOUR DOCTOR.   WHEN TO CALL US [phone]: Poor pain control Reactions / problems with new medications (rash/itching, nausea, etc)  Fever over 101.5 F (38.5 C) Inability to urinate Nausea and/or vomiting Worsening swelling or bruising Continued bleeding from incision. Increased pain, redness, or drainage from the incision   The clinic staff is available to answer your questions during regular business hours (8:30am-5pm).  Please don't hesitate to call and ask to speak to one of our nurses for clinical concerns.   If you have a medical emergency, go to the nearest emergency room or call 911.  A surgeon from [facility] is always on call at the hospitals   [facility]   [address], [address], [place], [place]  [address] ? MAIN: [phone] ? TOLL FREE: [phone] ?  FAX [phone] [email or web address]  ##############################################################  

## 2021-11-13 NOTE — Transfer of Care (Signed)
Immediate Anesthesia Transfer of Care Note  Patient: Christy Jenkins  Procedure(s) Performed: XI ROBOTIC ASSISTED PARAESOPHAGEAL HIATAL HERNIA REPAIR WITH FUNDOPLICATION, BILATERAL TAP BLOCK  Patient Location: PACU  Anesthesia Type:General  Level of Consciousness: awake, alert , oriented and patient cooperative  Airway & Oxygen Therapy: Patient Spontanous Breathing and Patient connected to face mask oxygen  Post-op Assessment: Report given to RN and Post -op Vital signs reviewed and stable  Post vital signs: Reviewed and stable  Last Vitals:  Vitals Value Taken Time  BP 163/74 11/13/21 1639  Temp    Pulse 94 11/13/21 1643  Resp 23 11/13/21 1643  SpO2 88 % 11/13/21 1643  Vitals shown include unvalidated device data.  Last Pain:  Vitals:   11/13/21 1152  TempSrc:   PainSc: 0-No pain         Complications: No notable events documented.

## 2021-11-13 NOTE — Interval H&P Note (Signed)
History and Physical Interval Note:  11/13/2021 11:48 AM  Christy Jenkins  has presented today for surgery, with the diagnosis of PARAESOPHAGEAL HIATAL HERNIA WITH FUNDOPLICATION.  The various methods of treatment have been discussed with the patient and family. After consideration of risks, benefits and other options for treatment, the patient has consented to  Procedure(s): XI ROBOTIC ASSISTED PARAESOPHAGEAL HIATAL HERNIA REPAIR WITH FUNDOPLICATION (N/A) as a surgical intervention.  The patient's history has been reviewed, patient examined, no change in status, stable for surgery.  I have reviewed the patient's chart and labs.  Questions were answered to the patient's satisfaction.    I have re-reviewed the the patient's records, history, medications, and allergies.  I have re-examined the patient.  I again discussed intraoperative plans and goals of post-operative recovery.  The patient agrees to proceed.  Christy Jenkins  [birth year] [id number]  Patient Care Team: Robyne Peers, MD as PCP - General (Family Medicine) Berniece Salines, DO as PCP - Cardiology (Cardiology) Debara Pickett Nadean Corwin, MD as Consulting Physician (Cardiology) Lavena Bullion, DO as Consulting Physician (Gastroenterology)  Patient Active Problem List   Diagnosis Date Noted   Heartburn    Dysphagia    Mild CAD 05/22/2021   Liver cyst 05/22/2021   Hiatal hernia 05/22/2021   Aortic atherosclerosis ([place]) 05/22/2021   Obesity (BMI 30-39.9) 05/22/2021   Hypertension 01/17/2021   Hyperlipidemia 01/17/2021   Morbid obesity ([place]) 01/17/2021   Family history of premature CAD 01/17/2021    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    Dysrhythmia    brady   GERD (gastroesophageal reflux disease)    Headache    mirgaines   Heart murmur    Hypertension    Pneumonia    Stroke  ([place])    Tremors of nervous system     Past Surgical History:  Procedure Laterality Date   ABDOMINAL HYSTERECTOMY     BLADDER SUSPENSION  2001   CHOLECYSTECTOMY     ESOPHAGEAL MANOMETRY N/A 09/06/2021   Procedure: ESOPHAGEAL MANOMETRY (EM);  Surgeon: Lavena Bullion, DO;  Location: [facility];  Service: Gastroenterology;  Laterality: N/A;   HERNIA REPAIR     LUMBAR DISC SURGERY     right foot surgery     rods and pins   TONSILLECTOMY     WISDOM TOOTH EXTRACTION  1978   WRIST SURGERY Bilateral     Social History   Socioeconomic History   Marital status: Single    Spouse name: Not on file   Number of children: Not on file   Years of education: Not on file   Highest education level: Not on file  Occupational History   Not on file  Tobacco Use   Smoking status: Former    Types: Cigarettes   Smokeless tobacco: Not on file  Vaping Use   Vaping Use: Never used  Substance and Sexual Activity   Alcohol use: Yes    Comment: rare   Drug use: Never   Sexual activity: Not on file  Other Topics Concern   Not on file  Social History Narrative   Not on file   Social Determinants of Health   Financial Resource Strain: Not on file  Food Insecurity: Not on file  Transportation Needs: Not on file  Physical Activity:  Not on file  Stress: Not on file  Social Connections: Not on file  Intimate Partner Violence: Not on file    Family History  Problem Relation Age of Onset   Heart attack Mother    Heart disease Mother    Hypertension Mother    Diabetes Father    Heart disease Father    Diabetes Brother    Colon polyps Brother    Stomach cancer Neg Hx    Esophageal cancer Neg Hx     Medications Prior to Admission  Medication Sig Dispense Refill Last Dose   ALPRAZolam (XANAX) 0.25 MG tablet Take 0.25 mg by mouth 3 (three) times daily as needed for anxiety.   11/13/2021 at 0900   aspirin EC 81 MG tablet Take 1 tablet (81 mg total) by mouth daily. Swallow whole. 90  tablet 3 11/10/2021   CALCIUM CITRATE-VITAMIN D PO Take 2 tablets by mouth at bedtime.   11/12/2021   cetirizine (ZYRTEC) 10 MG tablet Take 10 mg by mouth daily as needed for allergies.   11/13/2021 at 0900   Cholecalciferol 25 MCG (1000 UT) tablet Take 1,000 Units by mouth daily.   11/12/2021   escitalopram (LEXAPRO) 20 MG tablet Take 20 mg by mouth at bedtime.   11/12/2021   Evolocumab (REPATHA SURECLICK) 458 MG/ML SOAJ Inject 1 Dose into the skin every 14 (fourteen) days. 2 mL 11 11/12/2021   ezetimibe (ZETIA) 10 MG tablet Take 10 mg by mouth at bedtime.   11/12/2021   fluticasone-salmeterol (ADVAIR) 100-50 MCG/ACT AEPB Inhale 1 puff into the lungs 2 (two) times daily.   11/13/2021 at 0800   gabapentin (NEURONTIN) 800 MG tablet Take 800 mg by mouth at bedtime.      latanoprost (XALATAN) 0.005 % ophthalmic solution Place 1 drop into both eyes at bedtime.   11/12/2021   losartan (COZAAR) 100 MG tablet Take 100 mg by mouth daily.   11/12/2021   Magnesium 250 MG TABS Take 250 mg by mouth daily.   11/12/2021   methocarbamol (ROBAXIN) 500 MG tablet Take 500 mg by mouth every 8 (eight) hours as needed for muscle spasms.   Past Month   montelukast (SINGULAIR) 10 MG tablet Take 10 mg by mouth at bedtime.   11/12/2021   Multiple Vitamin (MULTI-VITAMIN) tablet Take 1 tablet by mouth daily.   11/12/2021   pantoprazole (PROTONIX) 40 MG tablet Take 40 mg by mouth 2 (two) times daily.   11/13/2021 at 0900   primidone (MYSOLINE) 50 MG tablet Take 100 mg by mouth at bedtime.   11/12/2021   tiZANidine (ZANAFLEX) 2 MG tablet Take 2 mg by mouth every 8 (eight) hours as needed for muscle spasms.   Past Month   vitamin B-12 (CYANOCOBALAMIN) 500 MCG tablet Take 500 mcg by mouth daily.   11/12/2021   zonisamide (ZONEGRAN) 100 MG capsule Take 100 mg by mouth at bedtime.   11/12/2021   albuterol (VENTOLIN HFA) 108 (90 Base) MCG/ACT inhaler Inhale 1-2 puffs into the lungs every 4 (four) hours as needed for wheezing or shortness of  breath.   More than a month   naproxen (NAPROSYN) 500 MG tablet Take 500 mg by mouth daily as needed for moderate pain or headache.   More than a month    Current Facility-Administered Medications  Medication Dose Route Frequency Provider Last Rate Last Admin   bupivacaine liposome (EXPAREL) 1.3 % injection 266 mg  20 mL Infiltration Once Michael Boston, MD  cefTRIAXone (ROCEPHIN) 2 g in sodium chloride 0.9 % 100 mL IVPB  2 g Intravenous On Call to OR Michael Boston, MD       And   metroNIDAZOLE (FLAGYL) IVPB 500 mg  500 mg Intravenous On Call to OR Michael Boston, MD       Chlorhexidine Gluconate Cloth 2 % PADS 6 each  6 each Topical Once Michael Boston, MD       And   Chlorhexidine Gluconate Cloth 2 % PADS 6 each  6 each Topical Once Michael Boston, MD       dexamethasone (DECADRON) injection 4 mg  4 mg Intravenous On Call to OR Michael Boston, MD       lactated ringers infusion   Intravenous Continuous Annye Asa, MD       phenylephrine (NEOSYNEPHRINE) 20-0.9 MG/250ML-% infusion            scopolamine (TRANSDERM-SCOP) 1 MG/3DAYS 1.5 mg  1 patch Transdermal On Call to OR Michael Boston, MD   1.5 mg at 11/13/21 1136     Allergies  Allergen Reactions   Statins     Severe muscle aches    Tape Rash    Paper tape okay    BP (!) 145/65   Pulse 71   Temp 98.5 F (36.9 C) (Oral)   Resp 16   SpO2 96%   Labs: No results found for this or any previous visit (from the past 48 hour(s)).  Imaging / Studies: NM GASTRIC EMPTYING  Result Date: 10/22/2021 CLINICAL DATA:  Abdominal pain concern for gastroparesis. EXAM: NUCLEAR MEDICINE GASTRIC EMPTYING SCAN TECHNIQUE: After oral ingestion of radiolabeled meal, sequential abdominal images were obtained for 120 minutes. Residual percentage of activity remaining within the stomach was calculated at 60 and 120 minutes. RADIOPHARMACEUTICALS:  2.0 mCi Tc-54msulfur colloid in standardized meal COMPARISON:  CT abdomen and pelvis May 04, 2017  FINDINGS: Expected location of the stomach in the left upper quadrant. Ingested meal empties the stomach gradually over the course of the study with 33% retention at 60 min and 2% retention at 120 min (normal retention less than 30% at a 120 min). IMPRESSION: Normal gastric emptying study. Electronically Signed   By: JDahlia BailiffM.D.   On: 10/22/2021 14:22     .SAdin Hector M.D., F.A.C.S. Gastrointestinal and Minimally Invasive Surgery [facility] [address] [place] [phone] / Paging  11/13/2021 11:48 AM    SAdin Hector

## 2021-11-13 NOTE — Anesthesia Procedure Notes (Signed)
Procedure Name: Intubation Date/Time: 11/13/2021 1:50 PM  Performed by: Victoriano Lain, CRNAPre-anesthesia Checklist: Patient identified, Emergency Drugs available, Suction available, Patient being monitored and Timeout performed Patient Re-evaluated:Patient Re-evaluated prior to induction Oxygen Delivery Method: Circle system utilized Preoxygenation: Pre-oxygenation with 100% oxygen Induction Type: IV induction Ventilation: Mask ventilation without difficulty Laryngoscope Size: Mac and 4 Grade View: Grade I Tube type: Oral Tube size: 7.5 mm Number of attempts: 2 Airway Equipment and Method: Stylet Placement Confirmation: ETT inserted through vocal cords under direct vision, positive ETCO2 and breath sounds checked- equal and bilateral Secured at: 21 cm Tube secured with: Tape Dental Injury: Teeth and Oropharynx as per pre-operative assessment

## 2021-11-14 ENCOUNTER — Encounter (HOSPITAL_COMMUNITY): Payer: Self-pay | Admitting: Surgery

## 2021-11-14 ENCOUNTER — Inpatient Hospital Stay (HOSPITAL_COMMUNITY): Payer: PPO

## 2021-11-14 MED ORDER — PRIMIDONE 50 MG PO TABS: 50.0000 mg | ORAL_TABLET | Freq: Every day | ORAL | Status: DC

## 2021-11-14 NOTE — Anesthesia Postprocedure Evaluation (Signed)
Anesthesia Post Note  Patient: Christy Jenkins  Procedure(s) Performed: XI ROBOTIC ASSISTED PARAESOPHAGEAL HIATAL HERNIA REPAIR WITH FUNDOPLICATION, BILATERAL TAP BLOCK     Patient location during evaluation: Other Anesthesia Type: General Level of consciousness: awake and alert Pain management: pain level controlled Vital Signs Assessment: post-procedure vital signs reviewed and stable Respiratory status: spontaneous breathing, nonlabored ventilation, respiratory function stable and patient connected to nasal cannula oxygen Cardiovascular status: blood pressure returned to baseline and stable Postop Assessment: no apparent nausea or vomiting Anesthetic complications: no   No notable events documented.  Last Vitals:  Vitals:   11/14/21 1827 11/14/21 1945  BP: (!) 166/94 (!) 179/84  Pulse: 77 78  Resp: 17 17  Temp: 36.8 C 36.9 C  SpO2: 96% 96%    Last Pain:  Vitals:   11/14/21 1945  TempSrc: Oral  PainSc:                  [name],W. EDMOND

## 2021-11-14 NOTE — Progress Notes (Signed)
Mobility Specialist - Progress Note   11/14/21 1300  Mobility  Activity Ambulated with assistance in hallway  Level of Assistance Modified independent, requires aide device or extra time  Assistive Device None  Distance Ambulated (ft) 250 ft  Activity Response Tolerated well  $Mobility charge 1 Mobility   Pt was found in recliner chair and agreeable to mobilize. HHA during ambulation due to slight tremors. Was left in recliner chair with all necessities in reach and family in room.  Ferd Hibbs Mobility Specialist

## 2021-11-14 NOTE — Progress Notes (Signed)
Christy Jenkins [id number] 02/01/1951  CARE TEAM:  PCP: Robyne Peers, MD  Outpatient Care Team: Patient Care Team: Robyne Peers, MD as PCP - General (Family Medicine) Berniece Salines, DO as PCP - Cardiology (Cardiology) Debara Pickett Nadean Corwin, MD as Consulting Physician (Cardiology) Lavena Bullion, DO as Consulting Physician (Gastroenterology) Michael Boston, MD as Consulting Physician (General Surgery)  Inpatient Treatment Team: Treatment Team: Attending Provider: Michael Boston, MD; Technician: Jeralyn Ruths, NT; Utilization Review: Lacretia Leigh, RN; Registered Nurse: Marlis Edelson, RN   Problem List:   Principal Problem:   Hiatal hernia with gastroesophageal reflux Active Problems:   Essential (primary) hypertension   Obesity (BMI 30-39.9)   S/P Nissen fundoplication (without gastrostomy tube) procedure   1 Day Post-Op  11/13/2021  POST-OPERATIVE DIAGNOSIS:  PARAESOPHAGEAL HIATAL HERNIA    PROCEDURE:     1. ROBOTIC reduction of paraesophageal hiatal hernia 2. Type II mediastinal dissection. 3. Primary repair of hiatal hernia over pledgets.  4. Anterior & posterior gastropexy. 5. Toupet (partial posterior 643 deg) fundoplication  6. Mesh reinforcement with absorbable mesh   SURGEON:  Adin Hector, MD  OR FINDINGS:    Moderate-sized paraesophageal hiatal hernia with 20% of the stomach in the mediastinum.  There was a 7 x 6 cm hiatal defect.   It is a primary repair over pledgets. Mesh reinforcement was used with Mesh was used: PhasixT Mesh (a knitted monofilament mesh scaffold using Poly-4-hydroxybutyrate (P4HB), a biologically derived, fully resorbable material)   The patient has a 4 cm partial posterior Toupet fundoplication x 4cm length.  The patient has had anterior and posterior gastropexies.  Assessment    [facility] Stay = 1 days)  Plan:  Stabilizing so far.  Thin liquids low volume as tolerated.  Esophagram this morning.  If no  leak nor obstruction, advance to pured diet  Hypertension control.  Chronic pain control.  Anxiolysis. -VTE prophylaxis- SCDs, etc -mobilize as tolerated to help recovery  Disposition:  Disposition:  The patient is from: Home  Anticipate discharge to:  Home  Anticipated Date of Discharge is:  August 18,2023    Barriers to discharge:  Pending Clinical improvement (more likely than not)  Patient currently is NOT MEDICALLY STABLE for discharge from the hospital from a surgery standpoint.      I reviewed nursing notes, last 24 h vitals and pain scores, last 48 h intake and output, last 24 h labs and trends, and last 24 h imaging results. I have reviewed this patient's available data, including medical history, events of note, test results, etc as part of my evaluation.  A significant portion of that time was spent in counseling.  Care during the described time interval was provided by me.  This care required moderate level of medical decision making.  11/14/2021    Subjective: (Chief complaint)  Had some pain and nausea but feeling much better this morning.  Close friend at bedside.  Tolerating liquids.  Objective:  Vital signs:  Vitals:   11/13/21 2108 11/13/21 2117 11/14/21 0159 11/14/21 0546  BP:  (!) 181/94 (!) 156/87 (!) 143/78  Pulse:  94 94 90  Resp:  '18 18 18  '$ Temp:  97.8 F (36.6 C) 97.7 F (36.5 C) 98.7 F (37.1 C)  TempSrc:  Oral Oral Oral  SpO2: 94% 94% 93% 93%  Weight:      Height:        Last BM Date : 11/12/21  Intake/Output   Yesterday:  08/16 0701 - 08/17 0700 In: 2608.2 [P.O.:360; I.V.:1798.2; IV Piggyback:450] Out: 1920 [Urine:1400; Drains:470; Blood:50] This shift:  Total I/O In: 173.4 [I.V.:173.4] Out: -   Bowel function:  Flatus: No  BM:  No  Drain: Serosanguinous   Physical Exam:  General: Pt awake/alert in no acute distress Eyes: PERRL, normal EOM.  Sclera clear.  No icterus Neuro: CN II-XII intact w/o focal  sensory/motor deficits. Lymph: No head/neck/groin lymphadenopathy Psych:  No delerium/psychosis/paranoia.  Oriented x 4 HENT: Normocephalic, Mucus membranes moist.  No thrush Neck: Supple, No tracheal deviation.  No obvious thyromegaly Chest: No pain to chest wall compression.  Good respiratory excursion.  No audible wheezing CV:  Pulses intact.  Regular rhythm.  No major extremity edema MS: Normal AROM mjr joints.  No obvious deformity  Abdomen: Soft.  Nondistended.  Mildly tender at incisions only.  No evidence of peritonitis.  No incarcerated hernias.  Ext:   No deformity.  No mjr edema.  No cyanosis Skin: No petechiae / purpurea.  No major sores.  Warm and dry    Results:   Cultures: No results found for this or any previous visit (from the past 720 hour(s)).  Labs: No results found for this or any previous visit (from the past 48 hour(s)).  Imaging / Studies: No results found.  Medications / Allergies: per chart  Antibiotics: Anti-infectives (From admission, onward)    Start     Dose/Rate Route Frequency Ordered Stop   11/13/21 1100  cefTRIAXone (ROCEPHIN) 2 g in sodium chloride 0.9 % 100 mL IVPB       See Hyperspace for full Linked Orders Report.   2 g 200 mL/hr over 30 Minutes Intravenous On call to O.R. 11/13/21 1058 11/13/21 1421   11/13/21 1100  metroNIDAZOLE (FLAGYL) IVPB 500 mg       See Hyperspace for full Linked Orders Report.   500 mg 100 mL/hr over 60 Minutes Intravenous On call to O.R. 11/13/21 1058 11/13/21 1438         Note: Portions of this report may have been transcribed using voice recognition software. Every effort was made to ensure accuracy; however, inadvertent computerized transcription errors may be present.   Any transcriptional errors that result from this process are unintentional.    Adin Hector, MD, FACS, MASCRS Esophageal, Gastrointestinal & Colorectal Surgery Robotic and Minimally Invasive Surgery  [facility]. [address], [place], [place] [address] [phone] Fax [phone] Main  CONTACT INFORMATION:  Weekday (9AM-5PM): Call [facility] main office at [phone]  Weeknight (5PM-9AM) or Weekend/Holiday: Check [email or web address] (password " [email or web address]") for General Surgery [facility] coverage  (Please, do not use SecureChat as it is not reliable communication to reach operating surgeons for immediate patient care)      11/14/2021  7:38 AM

## 2021-11-14 NOTE — Progress Notes (Signed)
  Transition of Care [facility]) Screening Note   Patient Details  Name: Christy Jenkins Date of Birth: [birth year]   [facility]) CM/SW Contact:    Lennart Pall, LCSW Phone Number: 11/14/2021, 9:38 AM    Transition of Care Department [facility]) has reviewed patient and no TOC needs have been identified at this time. We will continue to monitor patient advancement through interdisciplinary progression rounds. If new patient transition needs arise, please place a TOC consult.

## 2021-11-15 MED ORDER — ACETAMINOPHEN 160 MG/5ML PO SOLN
1000.0000 mg | Freq: Four times a day (QID) | ORAL | Status: DC
  Administered 2021-11-15: 1000 mg via ORAL
  Filled 2021-11-15: qty 40.6

## 2021-11-15 MED ORDER — PROCHLORPERAZINE MALEATE 5 MG PO TABS
5.0000 mg | ORAL_TABLET | Freq: Four times a day (QID) | ORAL | 10 refills | Status: DC | PRN
Start: 2021-11-15 — End: 2023-02-10

## 2021-11-15 MED ORDER — PANTOPRAZOLE SODIUM 40 MG PO TBEC
40.0000 mg | DELAYED_RELEASE_TABLET | Freq: Every day | ORAL | Status: DC
  Administered 2021-11-15: 40 mg via ORAL
  Filled 2021-11-15: qty 1

## 2021-11-15 MED ORDER — OXYCODONE HCL 5 MG/5ML PO SOLN: 5.0000 mg | ORAL | Status: DC | PRN

## 2021-11-15 MED ORDER — FUROSEMIDE 10 MG/ML IJ SOLN
40.0000 mg | Freq: Once | INTRAMUSCULAR | Status: AC
  Administered 2021-11-15: 40 mg via INTRAVENOUS
  Filled 2021-11-15: qty 4

## 2021-11-15 NOTE — Plan of Care (Signed)

## 2021-11-15 NOTE — Progress Notes (Signed)
Nutrition Brief Note  Consult received for patient who is s/p gastric fundoplication with need for pureed diet x2-3 weeks to allow for resolution of wrap edema.   Wt Readings from Last 15 Encounters:  11/13/21 88.1 kg  11/07/21 88.1 kg  09/25/21 90.7 kg  09/18/21 90.7 kg  08/22/21 93 kg  07/22/21 93.2 kg  05/28/21 90 kg  05/22/21 90.4 kg  01/17/21 90.8 kg  11/05/20 90.7 kg    Body mass index is 34.96 kg/m. Patient meets criteria for obesity based on current BMI.   Current diet order is Dysphagia 1, thin advanced from FLD today at 0720 and she consumed 50% of breakfast. Labs and medications reviewed.   Handout placed in AVS by Surgery team. No nutrition interventions warranted at this time. If nutrition issues arise, please re-consult RD.       Jarome Matin, MS, RD, LDN, [place] Registered Dietitian II Inpatient Clinical Nutrition RD pager # and on-call/weekend pager # available in [facility]

## 2021-11-15 NOTE — Progress Notes (Signed)
Assessment unchanged. Pt and daughter verbalized understanding of dc instructions including medications and follow up care. Discharged via wc by NT.

## 2021-11-15 NOTE — Discharge Summary (Signed)
Physician Discharge Summary    Patient ID: Christy Jenkins MRN: [id number] DOB/AGE: [birth year]  71 y.o.  Patient Care Team: Robyne Peers, MD as PCP - General (Family Medicine) Berniece Salines, DO as PCP - Cardiology (Cardiology) Debara Pickett Nadean Corwin, MD as Consulting Physician (Cardiology) Lavena Bullion, DO as Consulting Physician (Gastroenterology) Michael Boston, MD as Consulting Physician (General Surgery)  Admit date: 11/13/2021  Discharge date: 11/15/2021  Hospital Stay = 2 days    Discharge Diagnoses:  Principal Problem:   Hiatal hernia with gastroesophageal reflux Active Problems:   Essential (primary) hypertension   Obesity (BMI 30-39.9)   S/P Nissen fundoplication (without gastrostomy tube) procedure   2 Days Post-Op  11/13/2021  POST-OPERATIVE DIAGNOSIS:   PARAESOPHAGEAL HIATAL HERNIA WITH FUNDOPLICATION  SURGERY:  9/37/1696  Procedure(s): XI ROBOTIC ASSISTED PARAESOPHAGEAL HIATAL HERNIA REPAIR WITH FUNDOPLICATION, BILATERAL TAP BLOCK  SURGEON:    Surgeon(s): Michael Boston, MD Leighton Ruff, MD  Consults: Nutrition and Case Management / Social Work  Hospital Course:   The patient underwent the surgery above.  Postoperatively, the patient gradually mobilized and advanced to a solid diet.  Pain and other symptoms were treated aggressively.  Postoperative esophagram confirm hiatal hernia repair.  Some slow transit across the fundoplication not surprising but no obstruction.  By the time of discharge, the patient was walking well the hallways, tolerating liquid diet,  having flatus.  Pain was well-controlled on an oral medications.  Based on meeting discharge criteria and continuing to recover, I felt it was safe for the patient to be discharged from the hospital to further recover with close followup. Postoperative recommendations were discussed in detail.  They are written as well.  Discharged Condition: Good  Discharge Exam: Blood pressure (!)  174/89, pulse 87, temperature 98.4 F (36.9 C), temperature source Oral, resp. rate 17, height 5' 2.5" (1.588 m), weight 88.1 kg, SpO2 96 %.  General: Pt awake/alert/oriented x4 in No acute distress Eyes: PERRL, normal EOM.  Sclera clear.  No icterus Neuro: CN II-XII intact w/o focal sensory/motor deficits. Lymph: No head/neck/groin lymphadenopathy Psych:  No delerium/psychosis/paranoia HENT: Normocephalic, Mucus membranes moist.  No thrush Neck: Supple, No tracheal deviation Chest:  No chest wall pain w good excursion CV:  Pulses intact.  Regular rhythm MS: Normal AROM mjr joints.  No obvious deformity Abdomen: Soft.  Nondistended.  Mildly tender at incisions only.  No evidence of peritonitis.  No incarcerated hernias.  Drain serosanguineous and thin. Ext:  SCDs BLE.  No mjr edema.  No cyanosis Skin: No petechiae / purpura   Disposition:    Follow-up Information     Michael Boston, MD Follow up in 3 week(s).   Specialties: General Surgery, Colon and Rectal Surgery Why: To follow up after your operation Contact information: [place] [address] [phone]                 Discharge disposition: 01-Home or Self Care       Discharge Instructions     Call MD for:   Complete by: As directed    FEVER > 101.5 F  (temperatures < 101.5 F are not significant)   Call MD for:   Complete by: As directed    Temperature > 101.36F   Call MD for:  extreme fatigue   Complete by: As directed    Call MD for:  extreme fatigue   Complete by: As directed    Call MD for:  hives  Complete by: As directed    Call MD for:  persistant dizziness or light-headedness   Complete by: As directed    Call MD for:  persistant nausea and vomiting   Complete by: As directed    Call MD for:  persistant nausea and vomiting   Complete by: As directed    Call MD for:  redness, tenderness, or signs of infection (pain, swelling, redness, odor or green/yellow  discharge around incision site)   Complete by: As directed    Call MD for:  redness, tenderness, or signs of infection (pain, swelling, redness, odor or green/yellow discharge around incision site)   Complete by: As directed    Call MD for:  severe uncontrolled pain   Complete by: As directed    Call MD for:  severe uncontrolled pain   Complete by: As directed    Diet - low sodium heart healthy   Complete by: As directed    Start with a bland diet such as soups, liquids, starchy foods, low fat foods, etc. the first few days at home. Gradually advance to a solid, low-fat, high fiber diet by the end of the first week at home.   Add a fiber supplement to your diet (Metamucil, etc) If you feel full, bloated, or constipated, stay on a full liquid or pureed/blenderized diet for a few days until you feel better and are no longer constipated.   Diet general   Complete by: As directed    SEE ESOPHAGEAL SURGERY DIET INSTRUCTIONS  We using usually start you out on a pureed (blenderized) diet. Expect some sticking with swallowing over the next 1-2 months.   This is due to swelling around your esophagus at the wrap & hiatal diaphragm repair.  It will gradually ease off over the next few months.   Discharge instructions   Complete by: As directed    See Discharge Instructions If you are not getting better after two weeks or are noticing you are getting worse, contact our office [phone] for further advice.  We may need to adjust your medications, re-evaluate you in the office, send you to the emergency room, or see what other things we can do to help. The clinic staff is available to answer your questions during regular business hours (8:30am-5pm).  Please don't hesitate to call and ask to speak to one of our nurses for clinical concerns.    A surgeon from [facility] is always on call at the hospitals 24 hours/day If you have a medical emergency, go to the nearest emergency room or  call 911.   Discharge instructions   Complete by: As directed    Please see discharge instruction sheets.   Also refer to any handouts/printouts that may have been given from the [facility] surgery office (if you visited [place] there before surgery) Please call our office if you have any questions or concerns [phone]   Discharge wound care:   Complete by: As directed    It is good for closed incisions and even open wounds to be washed every day.  Shower every day.  Short baths are fine.  Wash the incisions and wounds clean with soap & water.    You may leave closed incisions open to air if it is dry.   You may cover the incision with clean gauze & replace it after your daily shower for comfort.  TEGADERM:  You have clear gauze band-aid dressings over your closed incision(s).  Remove the dressings  3 days after surgery.   Driving Restrictions   Complete by: As directed    You may drive when: - you are no longer taking narcotic prescription pain medication - you can comfortably wear a seatbelt - you can safely make sudden turns/stops without pain.   Driving Restrictions   Complete by: As directed    No driving until off narcotics and can safely swerve away without pain during an emergency   Increase activity slowly   Complete by: As directed    Start light daily activities --- self-care, walking, climbing stairs- beginning the day after surgery.  Gradually increase activities as tolerated.  Control your pain to be active.  Stop when you are tired.  Ideally, walk several times a day, eventually an hour a day.   Most people are back to most day-to-day activities in a few weeks.  It takes 4-6 weeks to get back to unrestricted, intense activity. If you can walk 30 minutes without difficulty, it is safe to try more intense activity such as jogging, treadmill, bicycling, low-impact aerobics, swimming, etc. Save the most intensive and strenuous activity for last (Usually 4-8 weeks after surgery) such as  sit-ups, heavy lifting, contact sports, etc.  Refrain from any intense heavy lifting or straining until you are off narcotics for pain control.  You will have off days, but things should improve week-by-week. DO NOT PUSH THROUGH PAIN.  Let pain be your guide: If it hurts to do something, don't do it.   Increase activity slowly   Complete by: As directed    Lifting restrictions   Complete by: As directed    If you can walk 30 minutes without difficulty, it is safe to try more intense activity such as jogging, treadmill, bicycling, low-impact aerobics, swimming, etc. Save the most intensive and strenuous activity for last (Usually 4-8 weeks after surgery) such as sit-ups, heavy lifting, contact sports, etc.   Refrain from any intense heavy lifting or straining until you are off narcotics for pain control.  You will have off days, but things should improve week-by-week. DO NOT PUSH THROUGH PAIN.  Let pain be your guide: If it hurts to do something, don't do it.  Pain is your body warning you to avoid that activity for another week until the pain goes down.   Lifting restrictions   Complete by: As directed    Avoid heavy lifting initially, <20 pounds at first.   Do not push through pain.   You have no specific weight limit: If it hurts to do, DON'T DO IT.    If you feel no pain, you are not injuring anything.  Pain will protect you from injury.   Coughing and sneezing are far more stressful to your incision than any lifting.   Avoid resuming heavy lifting (>50 pounds) or other intense activity until off all narcotic pain medications.   When want to exercise more, give yourself 2 weeks to gradually get back to full intense exercise/activity.   May shower / Bathe   Complete by: As directed    May shower / Bathe   Complete by: As directed    Elizabeth.  It is fine for dressings or wounds to be washed/rinsed.  Use gentle soap & water.  This will help the incisions and/or wounds get clean &  minimize infection.   May walk up steps   Complete by: As directed    May walk up steps   Complete by: As directed  Remove dressing in 72 hours   Complete by: As directed    Make sure all dressings are removed by the third day after surgery.  Leave incisions open to air.  OK to cover incisions with gauze or bandages as desired   Remove dressing in 72 hours   Complete by: As directed    You have closed incisions: Shower and bathe over these incisions with soap and water every day.  It is OK to wash over the dressings: they are waterproof. Remove all surgical dressings on postoperative day #3.  You do not need to replace dressings over the closed incisions unless you feel more comfortable with a Band-Aid covering it.   TEGADERM:  You have clear gauze band-aid dressings over your closed incision(s).  Remove the dressings 3 days after surgery.  Please call our office [phone] if you have further questions.   Sexual Activity Restrictions   Complete by: As directed    You may have sexual intercourse when it is comfortable. If it hurts to do something, stop.   Sexual Activity Restrictions   Complete by: As directed    Sexual activity as tolerated.  Do not push through pain.  Pain will protect you from injury.   Walk with assistance   Complete by: As directed    Walk over an hour a day.  May use a walker/cane/companion to help with balance and stamina.       Allergies as of 11/15/2021       Reactions   Statins    Severe muscle aches    Tape Rash   Paper tape okay        Medication List     TAKE these medications    albuterol 108 (90 Base) MCG/ACT inhaler Commonly known as: VENTOLIN HFA Inhale 1-2 puffs into the lungs every 4 (four) hours as needed for wheezing or shortness of breath.   ALPRAZolam 0.25 MG tablet Commonly known as: XANAX Take 0.25 mg by mouth 3 (three) times daily as needed for anxiety.   aspirin EC 81 MG tablet Take 1 tablet (81 mg total) by mouth  daily. Swallow whole.   CALCIUM CITRATE-VITAMIN D PO Take 2 tablets by mouth at bedtime.   cetirizine 10 MG tablet Commonly known as: ZYRTEC Take 10 mg by mouth daily as needed for allergies.   Cholecalciferol 25 MCG (1000 UT) tablet Take 1,000 Units by mouth daily.   cyanocobalamin 500 MCG tablet Commonly known as: VITAMIN B12 Take 500 mcg by mouth daily.   escitalopram 20 MG tablet Commonly known as: LEXAPRO Take 20 mg by mouth at bedtime.   ezetimibe 10 MG tablet Commonly known as: ZETIA Take 10 mg by mouth at bedtime.   fluticasone-salmeterol 100-50 MCG/ACT Aepb Commonly known as: ADVAIR Inhale 1 puff into the lungs 2 (two) times daily.   gabapentin 800 MG tablet Commonly known as: NEURONTIN Take 800 mg by mouth at bedtime.   latanoprost 0.005 % ophthalmic solution Commonly known as: XALATAN Place 1 drop into both eyes at bedtime.   losartan 100 MG tablet Commonly known as: COZAAR Take 100 mg by mouth daily.   Magnesium 250 MG Tabs Take 250 mg by mouth daily.   methocarbamol 500 MG tablet Commonly known as: ROBAXIN Take 500 mg by mouth every 8 (eight) hours as needed for muscle spasms.   montelukast 10 MG tablet Commonly known as: SINGULAIR Take 10 mg by mouth at bedtime.   Multi-Vitamin tablet Take 1 tablet by mouth  daily.   naproxen 500 MG tablet Commonly known as: NAPROSYN Take 500 mg by mouth daily as needed for moderate pain or headache.   oxyCODONE 5 MG immediate release tablet Commonly known as: Oxy IR/ROXICODONE Take 1 tablet (5 mg total) by mouth every 6 (six) hours as needed for moderate pain, severe pain or breakthrough pain.   pantoprazole 40 MG tablet Commonly known as: PROTONIX Take 40 mg by mouth 2 (two) times daily.   primidone 50 MG tablet Commonly known as: MYSOLINE Take 100 mg by mouth at bedtime.   prochlorperazine 5 MG tablet Commonly known as: COMPAZINE Take 1 tablet (5 mg total) by mouth every 6 (six) hours as needed  for nausea or vomiting.   Repatha SureClick 932 MG/ML Soaj Generic drug: Evolocumab Inject 1 Dose into the skin every 14 (fourteen) days.   tiZANidine 2 MG tablet Commonly known as: ZANAFLEX Take 2 mg by mouth every 8 (eight) hours as needed for muscle spasms.   zonisamide 100 MG capsule Commonly known as: ZONEGRAN Take 100 mg by mouth at bedtime.               Discharge Care Instructions  (From admission, onward)           Start     Ordered   11/13/21 0000  Discharge wound care:       Comments: It is good for closed incisions and even open wounds to be washed every day.  Shower every day.  Short baths are fine.  Wash the incisions and wounds clean with soap & water.    You may leave closed incisions open to air if it is dry.   You may cover the incision with clean gauze & replace it after your daily shower for comfort.  TEGADERM:  You have clear gauze band-aid dressings over your closed incision(s).  Remove the dressings 3 days after surgery.   11/13/21 1634            Significant Diagnostic Studies:  No results found for this or any previous visit (from the past 72 hour(s)).  DG ESOPHAGUS W SINGLE CM (SOL OR THIN BA)  Result Date: 11/14/2021 CLINICAL DATA:  Patient 1 day postop from robotic reduction of paraesophageal hiatal hernia and mesh reinforcement. Request received for single contrast medium esophagram to evaluate for leak/obstruction. EXAM: ESOPHAGUS/BARIUM SWALLOW/TABLET STUDY TECHNIQUE: Single contrast examination was performed using thin liquid barium. This exam was performed by Narda Rutherford, NP, and was supervised and interpreted by Suzy Bouchard, MD. FLUOROSCOPY: Radiation Exposure Index (as provided by the fluoroscopic device): 37.30 mGy Kerma COMPARISON:  Esophagram dated Jul 30, 2021 FINDINGS: Swallowing: Appears normal. No vestibular penetration or aspiration seen. Pharynx: Unremarkable. Esophagus: There is restriction of the distal esophagus at the  GE junction related to the lung complication. A thin stream of contrast does pass from the vacation. There is some stasis of contrast within the esophagus. No evidence of leak at the GE junction. Contrast does fill the fundus of the stomach ventrally. Esophageal motility: Some stasis of contrast related to recent fundoplication Hiatal Hernia: None. Gastroesophageal reflux: None visualized. Ingested 18m barium tablet: Not given Other: Surgical drain at the GE junction. Reduction of the hiatal hernia seen on prior for IMPRESSION: 1. No evidence of leak of contrast at the GE junction/fundoplication. 2. Thin stream of contrast passes through the fundoplication wrap. Significant stasis of contrast within the esophagus related to restriction at the GE junction. 3. Small volume contrast does fill  the gastric fundus. 4. Surgical drain in place. 5. Reduction of the hiatal hernia seen on comparison exam. Electronically Signed   By: Suzy Bouchard M.D.   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    Dysrhythmia    brady   GERD (gastroesophageal reflux disease)    Headache    mirgaines   Heart murmur    Hypertension    Pneumonia    Stroke ([place])    Tremors of nervous system     Past Surgical History:  Procedure Laterality Date   ABDOMINAL HYSTERECTOMY     BLADDER SUSPENSION  2001   CHOLECYSTECTOMY     ESOPHAGEAL MANOMETRY N/A 09/06/2021   Procedure: ESOPHAGEAL MANOMETRY (EM);  Surgeon: Lavena Bullion, DO;  Location: [facility];  Service: Gastroenterology;  Laterality: N/A;   HERNIA REPAIR     LUMBAR DISC SURGERY     right foot surgery     rods and pins   TONSILLECTOMY     WISDOM TOOTH EXTRACTION  1978   WRIST SURGERY Bilateral    XI ROBOTIC ASSISTED PARAESOPHAGEAL HERNIA REPAIR N/A 11/13/2021   Procedure: XI ROBOTIC ASSISTED  PARAESOPHAGEAL HIATAL HERNIA REPAIR WITH FUNDOPLICATION, BILATERAL TAP BLOCK;  Surgeon: Michael Boston, MD;  Location: [facility];  Service: General;  Laterality: N/A;    Social History   Socioeconomic History   Marital status: Single    Spouse name: Not on file   Number of children: Not on file   Years of education: Not on file   Highest education level: Not on file  Occupational History   Not on file  Tobacco Use   Smoking status: Former    Types: Cigarettes   Smokeless tobacco: Not on file  Vaping Use   Vaping Use: Never used  Substance and Sexual Activity   Alcohol use: Yes    Comment: rare   Drug use: Never   Sexual activity: Not on file  Other Topics Concern   Not on file  Social History Narrative   Not on file   Social Determinants of Health   Financial Resource Strain: Not on file  Food Insecurity: Not on file  Transportation Needs: Not on file  Physical Activity: Not on file  Stress: Not on file  Social Connections: Not on file  Intimate Partner Violence: Not on file    Family History  Problem Relation Age of Onset   Heart attack Mother    Heart disease Mother    Hypertension Mother    Diabetes Father    Heart disease Father    Diabetes Brother    Colon polyps Brother    Stomach cancer Neg Hx    Esophageal cancer Neg Hx     Current Facility-Administered Medications  Medication Dose Route Frequency Provider Last Rate Last Admin   acetaminophen (TYLENOL) 160 MG/5ML solution 1,000 mg  1,000 mg Oral Q6H Michael Boston, MD   1,000 mg at 11/15/21 0532   albuterol (PROVENTIL) (2.5 MG/3ML) 0.083% nebulizer solution 2.5 mg  2.5 mg Nebulization Q4H PRN Michael Boston, MD       ALPRAZolam Duanne Moron) tablet 0.25 mg  0.25 mg Oral TID PRN Michael Boston, MD       aspirin EC tablet 81 mg  81 mg Oral Daily Michael Boston,  MD   81 mg at 11/14/21 0915   bisacodyl (DULCOLAX) suppository 10 mg  10 mg Rectal Daily PRN Michael Boston, MD       cyanocobalamin (VITAMIN B12) tablet 500  mcg  500 mcg Oral Daily Michael Boston, MD   500 mcg at 11/14/21 0915   dexamethasone (DECADRON) injection 4 mg  4 mg Intravenous Gorden Harms, MD   4 mg at 11/14/21 2102   diphenhydrAMINE (BENADRYL) 12.5 MG/5ML elixir 12.5 mg  12.5 mg Oral Q6H PRN Michael Boston, MD       Or   diphenhydrAMINE (BENADRYL) injection 12.5 mg  12.5 mg Intravenous Q6H PRN Michael Boston, MD       enalaprilat (VASOTEC) injection 0.625-1.25 mg  0.625-1.25 mg Intravenous Q6H PRN Michael Boston, MD       enoxaparin (LOVENOX) injection 40 mg  40 mg Subcutaneous Ardeen Fillers, MD   40 mg at 11/14/21 2102   escitalopram (LEXAPRO) tablet 20 mg  20 mg Oral Ardeen Fillers, MD   20 mg at 11/14/21 2102   ezetimibe (ZETIA) tablet 10 mg  10 mg Oral Ardeen Fillers, MD   10 mg at 11/14/21 2102   furosemide (LASIX) injection 40 mg  40 mg Intravenous Once Michael Boston, MD       gabapentin (NEURONTIN) capsule 800 mg  800 mg Oral Ardeen Fillers, MD   800 mg at 11/14/21 2102   HYDROmorphone (DILAUDID) injection 0.5-2 mg  0.5-2 mg Intravenous Q4H PRN Michael Boston, MD   1 mg at 11/14/21 1302   latanoprost (XALATAN) 0.005 % ophthalmic solution 1 drop  1 drop Both Eyes Ardeen Fillers, MD   1 drop at 11/14/21 2101   lip balm (CARMEX) ointment   Topical BID Michael Boston, MD   1 Application at 32/67/12 2103   loratadine (CLARITIN) tablet 10 mg  10 mg Oral Daily Michael Boston, MD   10 mg at 11/14/21 0915   losartan (COZAAR) tablet 100 mg  100 mg Oral Daily Michael Boston, MD   100 mg at 11/14/21 0915   magic mouthwash  15 mL Oral QID PRN Michael Boston, MD       magnesium hydroxide (MILK OF MAGNESIA) suspension 30 mL  30 mL Oral Daily PRN Michael Boston, MD       magnesium oxide (MAG-OX) tablet 200 mg  200 mg Oral Daily Michael Boston, MD   200 mg at 11/14/21 0916   methocarbamol (ROBAXIN) tablet 500 mg  500 mg Oral Q6H PRN Michael Boston, MD   500 mg at 11/14/21 1935   metoCLOPramide (REGLAN) injection 5-10 mg  5-10 mg  Intravenous Q8H PRN Michael Boston, MD       metoprolol tartrate (LOPRESSOR) injection 5 mg  5 mg Intravenous Q6H PRN Michael Boston, MD       mometasone-formoterol (DULERA) 100-5 MCG/ACT inhaler 2 puff  2 puff Inhalation BID Michael Boston, MD   2 puff at 11/14/21 1911   montelukast (SINGULAIR) tablet 10 mg  10 mg Oral Ardeen Fillers, MD   10 mg at 11/14/21 2103   multivitamin with minerals tablet 1 tablet  1 tablet Oral Daily Michael Boston, MD   1 tablet at 11/14/21 0915   naproxen (NAPROSYN) tablet 500 mg  500 mg Oral Daily PRN Michael Boston, MD       ondansetron (ZOFRAN-ODT) disintegrating tablet 4 mg  4 mg Oral Q6H PRN Michael Boston, MD       Or  ondansetron (ZOFRAN) injection 4 mg  4 mg Intravenous Q6H PRN Michael Boston, MD   4 mg at 11/13/21 1922   oxyCODONE (ROXICODONE) 5 MG/5ML solution 5-10 mg  5-10 mg Oral Q4H PRN Michael Boston, MD       pantoprazole (PROTONIX) EC tablet 40 mg  40 mg Oral Daily Michael Boston, MD       primidone (MYSOLINE) tablet 100 mg  100 mg Oral Ardeen Fillers, MD   100 mg at 11/14/21 2102   prochlorperazine (COMPAZINE) tablet 10 mg  10 mg Oral Q6H PRN Michael Boston, MD       Or   prochlorperazine (COMPAZINE) injection 5-10 mg  5-10 mg Intravenous Q6H PRN Michael Boston, MD       simethicone (MYLICON) chewable tablet 40 mg  40 mg Oral Q6H PRN Michael Boston, MD       simethicone [facility]) chewable tablet 80 mg  80 mg Oral QID Michael Boston, MD   80 mg at 11/14/21 2317   tiZANidine (ZANAFLEX) tablet 2 mg  2 mg Oral Q8H PRN Michael Boston, MD       traMADol Veatrice Bourbon) tablet 50-100 mg  50-100 mg Oral Q6H PRN Michael Boston, MD       zonisamide (ZONEGRAN) capsule 100 mg  100 mg Oral Ardeen Fillers, MD   100 mg at 11/14/21 2101     Allergies  Allergen Reactions   Statins     Severe muscle aches    Tape Rash    Paper tape okay    Signed:   Adin Hector, MD, FACS, MASCRS Esophageal, Gastrointestinal & Colorectal Surgery Robotic and Minimally Invasive  Surgery  [facility] [place] [address], [place], [place] [address] [phone] Fax [phone] Main  CONTACT INFORMATION:  Weekday (9AM-5PM): Call [facility] main office at [phone]  Weeknight (5PM-9AM) or Weekend/Holiday: Check [email or web address] (password " [email or web address]") for General Surgery [facility] coverage  (Please, do not use SecureChat as it is not reliable communication to reach operating surgeons for immediate patient care)      11/15/2021, 7:54 AM

## 2022-01-15 ENCOUNTER — Telehealth: Payer: Self-pay | Admitting: Internal Medicine

## 2022-01-15 NOTE — Telephone Encounter (Signed)
PA for Repatha Sureclick submitted via [facility] (Key: [id number])

## 2022-01-16 NOTE — Telephone Encounter (Signed)
Medication approved until 01/16/2023

## 2022-02-18 ENCOUNTER — Encounter: Payer: Self-pay | Admitting: Internal Medicine

## 2022-02-18 LAB — NMR, LIPOPROFILE
Cholesterol, Total: 153 mg/dL (ref 100–199)
HDL Particle Number: 42.7 umol/L (ref >=30.5)
HDL-C: 89 mg/dL (ref >39)
LDL Particle Number: 485 nmol/L (ref <1000)
LDL Size: 21.1 nm (ref >20.5)
LDL-C (NIH Calc): 49 mg/dL (ref 0–99)
LP-IR Score: <25 (ref <=45)
Small LDL Particle Number: <90 nmol/L (ref <=527)
Triglycerides: 78 mg/dL (ref 0–149)

## 2022-02-18 LAB — LIPOPROTEIN A (LPA): Lipoprotein (a): 86.1 nmol/L — ABNORMAL HIGH (ref <75.0)

## 2022-03-04 ENCOUNTER — Encounter: Payer: Self-pay | Admitting: Internal Medicine

## 2022-03-04 ENCOUNTER — Telehealth: Payer: Self-pay

## 2022-03-04 ENCOUNTER — Ambulatory Visit: Payer: PPO | Attending: Cardiology | Admitting: Internal Medicine

## 2022-03-04 VITALS — BP 144/72 | HR 61 | Ht 63.0 in | Wt 180.7 lb

## 2022-03-04 DIAGNOSIS — R931 Abnormal findings on diagnostic imaging of heart and coronary circulation: Secondary | ICD-10-CM

## 2022-03-04 DIAGNOSIS — E785 Hyperlipidemia, unspecified: Secondary | ICD-10-CM | POA: Diagnosis not present

## 2022-03-04 DIAGNOSIS — M791 Myalgia, unspecified site: Secondary | ICD-10-CM | POA: Diagnosis not present

## 2022-03-04 DIAGNOSIS — T466X5D Adverse effect of antihyperlipidemic and antiarteriosclerotic drugs, subsequent encounter: Secondary | ICD-10-CM | POA: Diagnosis not present

## 2022-03-04 DIAGNOSIS — T466X5A Adverse effect of antihyperlipidemic and antiarteriosclerotic drugs, initial encounter: Secondary | ICD-10-CM

## 2022-03-04 NOTE — Addendum Note (Signed)
Addended by: Jacqulynn Cadet on: 03/04/2022 09:22 AM   Modules accepted: Orders

## 2022-03-04 NOTE — Patient Instructions (Signed)
Medication Instructions:   Your physician recommends that you continue on your current medications as directed. Please refer to the Current Medication list given to you today.  *If you need a refill on your cardiac medications before your next appointment, please call your pharmacy*   Lab Work: Your physician recommends that you return for lab work in 1 year prior to follow up:  NMR  If you have labs (blood work) drawn today and your tests are completely normal, you will receive your results only by: MyChart Message (if you have MyChart) OR A paper copy in the mail If you have any lab test that is abnormal or we need to change your treatment, we will call you to review the results.   Testing/Procedures: NONE ordered at this time of appointment   Follow-Up: At [facility], you and your health needs are our priority.  As part of our continuing mission to provide you with exceptional heart care, we have created designated Provider Care Teams.  These Care Teams include your primary Cardiologist (physician) and Advanced Practice Providers (APPs -  Physician Assistants and Nurse Practitioners) who all work together to provide you with the care you need, when you need it.   Your next appointment:   1 year(s)  The format for your next appointment:   In Person or Virtual  Provider:   Dr. [facility]      Other Instructions  Important Information About Sugar

## 2022-03-04 NOTE — Telephone Encounter (Addendum)
Called and left a voice message instructing patient that she can view her AVS from today's virtual visit with Dr. Debara Pickett and his recommendations. Asked to give office a call if she has any questions. AVS also mailed to patient.

## 2022-03-04 NOTE — Progress Notes (Signed)
Virtual Visit via Video Note   Because of Christy Jenkins's co-morbid illnesses, she is at least at moderate risk for complications without adequate follow up.  This format is felt to be most appropriate for this patient at this time.  All issues noted in this document were discussed and addressed.  A limited physical exam was performed with this format.  Please refer to the patient's chart for her consent to telehealth for [facility].      Date:  03/04/2022   ID:  Christy Jenkins, DOB 02/07/1951, MRN [id number] The patient was identified using 2 identifiers.  Evaluation Performed:  Follow-Up Visit  Patient Location:  [place] [id number]  Provider location:   [address] [place], [place] [address]  PCP:  Robyne Peers, MD  Cardiologist:  Berniece Salines, DO Electrophysiologist:  None   Chief Complaint:  Follow-up dyslipidemia  History of Present Illness:    Christy Jenkins is a 71 y.o. female who presents via audio/video conferencing for a telehealth visit today.  This is a pleasant female kindly referred by Dr. Harriet Masson for evaluation management of dyslipidemia.  Recently she had been having some symptoms concerning for angina and underwent Neri CT angiography, which demonstrated extensive coronary calcium with a calcium score of 939, 97th percentile for age and sex matched controls.  FFR fortunately was negative however aggressive cardiovascular risk reduction was recommended.  Unfortunately, she cannot tolerate statins.  In the past she has tried "almost every statin".  This was met with severe muscle aches which occurred within days of starting the medication and seem to resolve after stopping it.  Subsequently she was placed on ezetimibe however her lipids remain significantly elevated.  Recent total cholesterol was 236, triglycerides 90 HDL 87 LDL 131.  09/25/2021  Christy Jenkins returns today for follow-up.  Overall she says she is doing very well  with Repatha.  She has had some fatigue which she gets for about 1 to 2 days after injection.  This has been pretty consistent and has not really improved however she says is very tolerable and she was very excited about her degree of cholesterol lowering and would like to continue with treatment.  Total cholesterol now 163, triglycerides 91, HDL 95 and LDL 50 (down from 131).  Continues on ezetimibe as well.  03/04/2022  Christy Jenkins is seen today in follow-up.  She reports her fatigue has improved significantly.  Her cholesterol is now much lower with Strahl 153, triglycerides 78, HDL 89 and LDL 49.  Her LDL particle number has come down to 485.  Small LDL particle number is less than 90 nmol/L.  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    Dysrhythmia    brady   GERD (gastroesophageal reflux disease)    Headache    mirgaines   Heart murmur    Hypertension    Pneumonia    Stroke [facility])    Tremors of nervous system     Past Surgical History:  Procedure Laterality Date   ABDOMINAL HYSTERECTOMY     BLADDER SUSPENSION  2001  CHOLECYSTECTOMY     ESOPHAGEAL MANOMETRY N/A 09/06/2021   Procedure: ESOPHAGEAL MANOMETRY (EM);  Surgeon: Lavena Bullion, DO;  Location: [facility];  Service: Gastroenterology;  Laterality: N/A;   HERNIA REPAIR     LUMBAR DISC SURGERY     right foot surgery     rods and pins   TONSILLECTOMY     WISDOM TOOTH EXTRACTION  1978   WRIST SURGERY Bilateral    XI ROBOTIC ASSISTED PARAESOPHAGEAL HERNIA REPAIR N/A 11/13/2021   Procedure: XI ROBOTIC ASSISTED PARAESOPHAGEAL HIATAL HERNIA REPAIR WITH FUNDOPLICATION, BILATERAL TAP BLOCK;  Surgeon: Michael Boston, MD;   Location: [facility];  Service: General;  Laterality: N/A;    FAMHx:  Family History  Problem Relation Age of Onset   Heart attack Mother    Heart disease Mother    Hypertension Mother    Diabetes Father    Heart disease Father    Diabetes Brother    Colon polyps Brother    Stomach cancer Neg Hx    Esophageal cancer Neg Hx     SOCHx:   reports that she has quit smoking. Her smoking use included cigarettes. She does not have any smokeless tobacco history on file. She reports current alcohol use. She reports that she does not use drugs.  ALLERGIES:  Allergies  Allergen Reactions   Statins     Severe muscle aches    Tape Rash    Paper tape okay    MEDS:  Current Meds  Medication Sig   albuterol (VENTOLIN HFA) 108 (90 Base) MCG/ACT inhaler Inhale 1-2 puffs into the lungs every 4 (four) hours as needed for wheezing or shortness of breath.   ALPRAZolam (XANAX) 0.25 MG tablet Take 0.25 mg by mouth 3 (three) times daily as needed for anxiety.   aspirin EC 81 MG tablet Take 1 tablet (81 mg total) by mouth daily. Swallow whole.   CALCIUM CITRATE-VITAMIN D PO Take 2 tablets by mouth at bedtime.   cetirizine (ZYRTEC) 10 MG tablet Take 10 mg by mouth daily as needed for allergies.   Cholecalciferol 25 MCG (1000 UT) tablet Take 1,000 Units by mouth daily.   escitalopram (LEXAPRO) 20 MG tablet Take 20 mg by mouth at bedtime.   Evolocumab (REPATHA SURECLICK) 413 MG/ML SOAJ Inject 1 Dose into the skin every 14 (fourteen) days.   ezetimibe (ZETIA) 10 MG tablet Take 10 mg by mouth at bedtime.   fluticasone-salmeterol (ADVAIR) 100-50 MCG/ACT AEPB Inhale 1 puff into the lungs 2 (two) times daily.   gabapentin (NEURONTIN) 800 MG tablet Take 800 mg by mouth at bedtime.   latanoprost (XALATAN) 0.005 % ophthalmic solution Place 1 drop into both eyes at bedtime.   losartan (COZAAR) 100 MG tablet Take 100 mg by mouth daily.   Magnesium 250 MG TABS Take 250 mg by mouth daily.   methocarbamol (ROBAXIN)  500 MG tablet Take 500 mg by mouth every 8 (eight) hours as needed for muscle spasms.   montelukast (SINGULAIR) 10 MG tablet Take 10 mg by mouth at bedtime.   Multiple Vitamin (MULTI-VITAMIN) tablet Take 1 tablet by mouth daily.   primidone (MYSOLINE) 50 MG tablet Take 100 mg by mouth at bedtime.   prochlorperazine (COMPAZINE) 5 MG tablet Take 1 tablet (5 mg total) by mouth every 6 (six) hours as needed for nausea or vomiting.   tiZANidine (ZANAFLEX) 2 MG tablet Take 2 mg by mouth every 8 (eight) hours as needed for muscle spasms.   vitamin B-12 (CYANOCOBALAMIN)  500 MCG tablet Take 500 mcg by mouth daily.   zonisamide (ZONEGRAN) 100 MG capsule Take 100 mg by mouth at bedtime.     ROS: Pertinent items noted in HPI and remainder of comprehensive ROS otherwise negative.  Labs/Other Tests and Data Reviewed:    Recent Labs: 11/07/2021: BUN 19; Creatinine, Ser 0.77; Hemoglobin 12.8; Platelets 222; Potassium 4.5; Sodium 140   Recent Lipid Panel No results found for: "CHOL", "TRIG", "HDL", "CHOLHDL", "LDLCALC", "LDLDIRECT"  Wt Readings from Last 3 Encounters:  03/04/22 180 lb 11.2 oz (82 kg)  11/13/21 194 lb 3.6 oz (88.1 kg)  11/07/21 194 lb 4.8 oz (88.1 kg)     Exam:    Vital Signs:  BP (!) 144/72   Pulse 61   Ht _0  (1.6 m)   Wt 180 lb 11.2 oz (82 kg)   BMI 32.01 kg/m    General appearance: alert and no distress Lungs: no audible wheezing Abdomen: obese Extremities: extremities normal, atraumatic, no cyanosis or edema Neurologic: Grossly normal  ASSESSMENT & PLAN:    Mixed dyslipidemia, goal LDL less than 70 Extensive coronary calcium with a CAC score of 939, 97th percentile for age and sex matched controls Multiple family members with coronary artery disease, premature onset in her mother who had an MI in her 21s Statin intolerance-myalgias 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 Jenkins continues to do very well on Repatha and ezetimibe. LDL is at goal - her LP(a)  has improved. She is tolerating the medicine well. Plan follow-up with me annually lipids and Dr. Harriet Masson for her general cardiology needs.  COVID-19 Education: The signs and symptoms of COVID-19 were discussed with the patient and how to seek care for testing (follow up with PCP or arrange E-visit).  The importance of social distancing was discussed today.  Patient Risk:   After full review of this patients clinical status, I feel that they are at least moderate risk at this time.  Time:   Today, I have spent 15 minutes with the patient with telehealth technology discussing dyslipidemia.     Medication Adjustments/Labs and Tests Ordered: Current medicines are reviewed at length with the patient today.  Concerns regarding medicines are outlined above.   Tests Ordered: No orders of the defined types were placed in this encounter.   Medication Changes: No orders of the defined types were placed in this encounter.   Disposition:  in 1 year(s)  Pixie Casino, MD, [facility], [place] Director of the Advanced Lipid Disorders &  Cardiovascular Risk Reduction Clinic Diplomate of the American Board of Clinical Lipidology Attending Cardiologist  Direct Dial: [phone]  Fax: [phone]  Website:  [email or web address]  Pixie Casino, MD  03/04/2022 9:01 AM

## 2022-03-12 DIAGNOSIS — M958 Other specified acquired deformities of musculoskeletal system: Secondary | ICD-10-CM | POA: Insufficient documentation

## 2022-04-09 DIAGNOSIS — M19071 Primary osteoarthritis, right ankle and foot: Secondary | ICD-10-CM | POA: Insufficient documentation

## 2022-05-04 DIAGNOSIS — H2511 Age-related nuclear cataract, right eye: Secondary | ICD-10-CM | POA: Insufficient documentation

## 2022-05-08 ENCOUNTER — Encounter: Payer: Self-pay | Admitting: Internal Medicine

## 2022-05-09 MED ORDER — REPATHA SURECLICK 140 MG/ML ~~LOC~~ SOAJ: 1.0000 mL | SUBCUTANEOUS | 11 refills | Status: DC

## 2022-12-10 ENCOUNTER — Ambulatory Visit: Payer: PPO | Attending: Cardiology | Admitting: Cardiology

## 2022-12-10 ENCOUNTER — Encounter: Payer: Self-pay | Admitting: Cardiology

## 2022-12-10 VITALS — BP 134/78 | HR 63 | Ht 63.0 in | Wt 183.4 lb

## 2022-12-10 DIAGNOSIS — R0683 Snoring: Secondary | ICD-10-CM | POA: Diagnosis not present

## 2022-12-10 DIAGNOSIS — I2510 Atherosclerotic heart disease of native coronary artery without angina pectoris: Secondary | ICD-10-CM | POA: Diagnosis not present

## 2022-12-10 DIAGNOSIS — I10 Essential (primary) hypertension: Secondary | ICD-10-CM

## 2022-12-10 DIAGNOSIS — R5383 Other fatigue: Secondary | ICD-10-CM | POA: Diagnosis not present

## 2022-12-10 NOTE — Progress Notes (Signed)
Cardiology Office Note:    Date:  12/10/2022   ID:  Christy Jenkins, DOB [birth year], MRN [id number]  PCP:  Angelica Chessman, MD  Cardiologist:  Thomasene Ripple, DO  Electrophysiologist:  None   Referring MD: Angelica Chessman, MD   " I am doing ok"  History of Present Illness:    Christy Jenkins is a 72 y.o. female with a hx of mild coronary artery disease, hypertension, hyperlipidemia, obesity is here today for a follow up visit.   At her last visit we talked about her statin intolerance I refer her to our lipid clinic.  We also talked about the logistics that sent her for liver ultrasound I referred her to GI.  Since her visit with me she tells me she has been doing well for a while but recently she has had significant fatigue.  Her hemoglobin was done by her PCP this was within her stable range.  She is excited because she is expecting to gray-green children.  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    Dysrhythmia    brady   GERD (gastroesophageal reflux disease)    Headache    mirgaines   Heart murmur    Hypertension    Pneumonia    Stroke (HCC)    Tremors of nervous system     Past Surgical History:  Procedure Laterality Date   ABDOMINAL HYSTERECTOMY     BLADDER SUSPENSION  2001   CHOLECYSTECTOMY     ESOPHAGEAL MANOMETRY N/A 09/06/2021   Procedure: ESOPHAGEAL MANOMETRY (EM);  Surgeon: Shellia Cleverly, DO;  Location: [facility];  Service: Gastroenterology;  Laterality: N/A;   HERNIA REPAIR     LUMBAR DISC SURGERY     right foot surgery     rods and pins   TONSILLECTOMY     WISDOM TOOTH EXTRACTION  1978   WRIST SURGERY Bilateral    XI ROBOTIC ASSISTED PARAESOPHAGEAL HERNIA REPAIR N/A 11/13/2021   Procedure: XI ROBOTIC ASSISTED PARAESOPHAGEAL HIATAL HERNIA REPAIR WITH FUNDOPLICATION, BILATERAL TAP BLOCK;  Surgeon: Karie Soda, MD;  Location: [facility];  Service: General;  Laterality: N/A;    Current Medications: Current Meds  Medication Sig   albuterol (VENTOLIN HFA) 108 (90 Base) MCG/ACT inhaler Inhale 1-2 puffs into the lungs every 4 (four) hours as needed for wheezing or shortness of breath.   ALPRAZolam (XANAX) 0.25 MG tablet Take 0.25 mg by mouth 3 (three) times daily as needed for anxiety.   aspirin EC 81 MG tablet Take 1 tablet (81 mg total) by mouth daily. Swallow whole.   CALCIUM CITRATE-VITAMIN D PO Take 2 tablets by mouth at bedtime.   cetirizine (ZYRTEC) 10 MG tablet Take 10 mg by mouth daily as needed for allergies.   Cholecalciferol 25 MCG (1000 UT) tablet Take 1,000 Units by mouth daily.   escitalopram (LEXAPRO) 20 MG tablet Take 20 mg by mouth at bedtime.   Evolocumab (REPATHA SURECLICK) 140 MG/ML SOAJ Inject 140 mg into the skin every 14 (fourteen) days.   ezetimibe (ZETIA) 10 MG tablet Take 10 mg by mouth at bedtime.   gabapentin (NEURONTIN) 800 MG tablet Take 800 mg by mouth at bedtime.   latanoprost (XALATAN) 0.005 % ophthalmic solution Place 1 drop into both eyes at bedtime.   losartan (COZAAR) 100  MG tablet Take 100 mg by mouth daily.   Magnesium 250 MG TABS Take 250 mg by mouth daily.   methocarbamol (ROBAXIN) 500 MG tablet Take 500 mg by mouth every 8 (eight) hours as needed for muscle spasms.   montelukast (SINGULAIR) 10 MG tablet Take 10 mg by mouth at bedtime.   Multiple Vitamin (MULTI-VITAMIN) tablet Take 1 tablet by mouth daily.   primidone (MYSOLINE) 50 MG tablet Take 100 mg by mouth at bedtime.   prochlorperazine (COMPAZINE) 5 MG tablet Take 1 tablet (5 mg total) by mouth every 6 (six) hours as needed for nausea or vomiting.   tiZANidine (ZANAFLEX) 2 MG tablet Take 2 mg by mouth every 8 (eight) hours as needed for muscle spasms.   vitamin B-12 (CYANOCOBALAMIN) 500 MCG tablet Take 500 mcg by mouth daily.   zonisamide (ZONEGRAN) 100 MG capsule Take 100 mg by mouth at bedtime.      Allergies:   Statins and Tape   Social History   Socioeconomic History   Marital status: Single    Spouse name: Not on file   Number of children: Not on file   Years of education: Not on file   Highest education level: Not on file  Occupational History   Not on file  Tobacco Use   Smoking status: Former    Types: Cigarettes   Smokeless tobacco: Not on file  Vaping Use   Vaping status: Never Used  Substance and Sexual Activity   Alcohol use: Yes    Comment: rare   Drug use: Never   Sexual activity: Not on file  Other Topics Concern   Not on file  Social History Narrative   Not on file   Social Determinants of Health   Financial Resource Jenkins: Low Risk  (08/25/2022)   Received from [facility]   Overall Financial Resource Jenkins (CARDIA)    Difficulty of Paying Living Expenses: Not hard at all  Food Insecurity: Low Risk  (11/11/2022)   Received from [facility]   Hunger Vital Sign    Worried About Running Out of Food in the Last Year: Never true    Ran Out of Food in the Last Year: Never true  Transportation Needs: Not on file (11/11/2022)  Physical Activity: Insufficiently Active (08/25/2022)   Received from [facility]   Exercise Vital Sign    Days of Exercise per Week: 2 days    Minutes of Exercise per Session: 30 min  Stress: No Stress Concern Present (08/25/2022)   Received from [facility] - Occupational Stress Questionnaire    Feeling of Stress : Only a little  Social Connections: Moderately Integrated (08/25/2022)   Received from [facility]   Social Network    How would you rate your social network (family, work, friends)?: Adequate participation with social networks     Family History: The patient's family history includes Colon polyps in her brother; Diabetes in her brother and father; Heart attack in her mother; Heart disease in her father and mother; Hypertension in her mother. There is no history  of Stomach cancer or Esophageal cancer.  ROS:   Review of Systems  Constitution: Negative for decreased appetite, fever and weight gain.  HENT: Negative for congestion, ear discharge, hoarse voice and sore throat.   Eyes: Negative for discharge, redness, vision loss in right eye and visual halos.  Cardiovascular: Negative for chest pain, dyspnea on exertion, leg swelling, orthopnea and palpitations.  Respiratory:  Negative for cough, hemoptysis, shortness of breath and snoring.   Endocrine: Negative for heat intolerance and polyphagia.  Hematologic/Lymphatic: Negative for bleeding problem. Does not bruise/bleed easily.  Skin: Negative for flushing, nail changes, rash and suspicious lesions.  Musculoskeletal: Negative for arthritis, joint pain, muscle cramps, myalgias, neck pain and stiffness.  Gastrointestinal: Negative for abdominal pain, bowel incontinence, diarrhea and excessive appetite.  Genitourinary: Negative for decreased libido, genital sores and incomplete emptying.  Neurological: Negative for brief paralysis, focal weakness, headaches and loss of balance.  Psychiatric/Behavioral: Negative for altered mental status, depression and suicidal ideas.  Allergic/Immunologic: Negative for HIV exposure and persistent infections.    EKGs/Labs/Other Studies Reviewed:    The following studies were reviewed today:   EKG: Sinus rhythm, heart rate 63 bpm  TTE 02/05/2021 IMPRESSIONS   1. Probable liver cyst; suggest abdominal ultrasound to further assess.   2. Left ventricular ejection fraction, by estimation, is 60 to 65%. The left ventricle has normal function. The left ventricle has no regional  wall motion abnormalities. Left ventricular diastolic parameters were normal. The average left ventricular  global longitudinal Jenkins is -23.1 %. The global longitudinal Jenkins is normal.   3. Right ventricular systolic function is normal. The right ventricular size is normal. There is normal  pulmonary artery systolic pressure.   4. The mitral valve is normal in structure. Trivial mitral valve  regurgitation. No evidence of mitral stenosis.   5. The aortic valve is tricuspid. Aortic valve regurgitation is not  visualized. No aortic stenosis is present.   6. The inferior vena cava is normal in size with greater than 50%  respiratory variability, suggesting right atrial pressure of 3 mmHg.   Comparison(s): No prior Echocardiogram.   FINDINGS   Left Ventricle: Left ventricular ejection fraction, by estimation, is 60  to 65%. The left ventricle has normal function. The left ventricle has no  regional wall motion abnormalities. The average left ventricular global  longitudinal Jenkins is -23.1 %.  The global longitudinal Jenkins is normal. The left ventricular internal  cavity size was normal in size. There is no left ventricular hypertrophy.  Left ventricular diastolic parameters were normal.   Right Ventricle: The right ventricular size is normal.Right ventricular  systolic function is normal. There is normal pulmonary artery systolic  pressure. The tricuspid regurgitant velocity is 2.30 m/s, and with an  assumed right atrial pressure of 3 mmHg,  the estimated right ventricular systolic pressure is 24.2 mmHg.   Left Atrium: Left atrial size was normal in size.   Right Atrium: Right atrial size was normal in size.   Pericardium: There is no evidence of pericardial effusion.   Mitral Valve: The mitral valve is normal in structure. Trivial mitral  valve regurgitation. No evidence of mitral valve stenosis.   Tricuspid Valve: The tricuspid valve is normal in structure. Tricuspid  valve regurgitation is mild . No evidence of tricuspid stenosis.   Aortic Valve: The aortic valve is tricuspid. Aortic valve regurgitation is  not visualized. No aortic stenosis is present.   Pulmonic Valve: The pulmonic valve was normal in structure. Pulmonic valve  regurgitation is not  visualized. No evidence of pulmonic stenosis.   Aorta: The aortic root is normal in size and structure.   Venous: The inferior vena cava is normal in size with greater than 50%  respiratory variability, suggesting right atrial pressure of 3 mmHg.   IAS/Shunts: No atrial level shunt detected by color flow Doppler.   Additional Comments: Probable liver  cyst; suggest abdominal ultrasound to  further assess.   CCTA 01/28/2021 FINDINGS: Non-cardiac: See separate report from [facility].   No LA appendage thrombus. Pulmonary veins drain normally to the left atrium.   Calcium Score: 939 Agatston units.   Coronary Arteries: Right dominant with no anomalies   LM: Calcified plaque mid-distal left main, mild (<50%) stenosis.   LAD system: Calcified plaque proximal and mid LAD, mild (<50%) stenosis.   Circumflex system: Calicifed plaque mid LCx, minimal stenosis.   RCA system: Mixed plaque proximal RCA, mild (<50%) stenosis. Mixed plaque distal RCA, mild (<50%) stenosis.   IMPRESSION: 1. Coronary artery calcium score 939 Agatston units. This places the patient in the 97th percentile for age and gender, suggesting high risk for future cardiac events.   2. Extensive calcified plaque left main, proximal/mid LAD, and RCA. Suspect only mild stenosis, but given significant calcification with blooming artifact, will send for FFR.   Dalton Mclean     Electronically Signed   By: Marca Ancona M.D.   On: 01/28/2021 14:42    Addended by Laurey Morale, MD on 01/28/2021  2:44 PM   Study Result  Narrative & Impression  EXAM: OVER-READ INTERPRETATION  CT CHEST   The following report is an over-read performed by radiologist Dr. Trudie Reed of [facility] on 01/28/2021. This over-read does not include interpretation of cardiac or coronary anatomy or pathology. The coronary calcium score/coronary CTA interpretation by the cardiologist is attached.    COMPARISON:  None.   FINDINGS: Within the visualized portions of the thorax there are no suspicious appearing pulmonary nodules or masses, there is no acute consolidative airspace disease, no pleural effusions, no pneumothorax and no lymphadenopathy. Moderate-sized hiatal hernia. Visualized portions of the upper abdomen demonstrates a 3.6 x 2.6 cm low-attenuation lesion in segment 2 of the liver, compatible with a simple cyst. There are no aggressive appearing lytic or blastic lesions noted in the visualized portions of the skeleton.   IMPRESSION: 1.  Aortic Atherosclerosis (ICD10-I70.0). 2. Moderate-sized hiatal hernia.   Electronically Signed: By: Trudie Reed M.D. On: 01/28/2021 11:14     Recent Labs: No results found for requested labs within last 365 days.  Recent Lipid Panel No results found for: "CHOL", "TRIG", "HDL", "CHOLHDL", "VLDL", "LDLCALC", "LDLDIRECT"  Physical Exam:    VS:  BP 134/78   Pulse 63   Ht 5\' 3"  (1.6 m)   Wt 183 lb 6.4 oz (83.2 kg)   SpO2 96%   BMI 32.49 kg/m     Wt Readings from Last 3 Encounters:  12/10/22 183 lb 6.4 oz (83.2 kg)  03/04/22 180 lb 11.2 oz (82 kg)  11/13/21 194 lb 3.6 oz (88.1 kg)     GEN: Well nourished, well developed in no acute distress HEENT: Normal NECK: No JVD; No carotid bruits LYMPHATICS: No lymphadenopathy CARDIAC: S1S2 noted,RRR, no murmurs, rubs, gallops RESPIRATORY:  Clear to auscultation without rales, wheezing or rhonchi  ABDOMEN: Soft, non-tender, non-distended, +bowel sounds, no guarding. EXTREMITIES: No edema, No cyanosis, no clubbing MUSCULOSKELETAL:  No deformity  SKIN: Warm and dry NEUROLOGIC:  Alert and oriented x 3, non-focal PSYCHIATRIC:  Normal affect, good insight  ASSESSMENT:    1. Essential (primary) hypertension   2. Mild CAD   3. Fatigue, unspecified type   4. Snoring    PLAN:     Mild CAD-clinically no angina.  She has been started on aspirin 81 mg today, she also has  started on the  Repatha as well as she continued to Zetia.  Very happy for the patient.  Her last LDL was less than 55 mL/dL.  With the fatigue and snoring it would be best to rule out sleep apnea in this patient.  Will get set up with a home sleep study with the [facility].  The patient understands the need to lose weight with diet and exercise. We have discussed specific strategies for this.  The patient is in agreement with the above plan. The patient left the office in stable condition.  The patient will follow up in 12 months or sooner if needed.   Medication Adjustments/Labs and Tests Ordered: Current medicines are reviewed at length with the patient today.  Concerns regarding medicines are outlined above.  Orders Placed This Encounter  Procedures   TSH+T4F+T3Free   EKG 12-Lead   [facility] Sleep Study   No orders of the defined types were placed in this encounter.   Patient Instructions  Medication Instructions:  Your physician recommends that you continue on your current medications as directed. Please refer to the Current Medication list given to you today.    *If you need a refill on your cardiac medications before your next appointment, please call your pharmacy*   Lab Work: Your physician recommends that you have these labs drawn today: - TSH + FT4/FT3   If you have labs (blood work) drawn today and your tests are completely normal, you will receive your results only by: MyChart Message (if you have MyChart) OR A paper copy in the mail If you have any lab test that is abnormal or we need to change your treatment, we will call you to review the results.   Testing/Procedures:  WatchPAT?  Is a FDA cleared portable home sleep study test that uses a watch and 3 points of contact to monitor 7 different channels, including your heart rate, oxygen saturations, body position, snoring, and chest motion.  The study is easy to use from the comfort of your own home and accurately  detect sleep apnea.  Before bed, you attach the chest sensor, attached the sleep apnea bracelet to your nondominant hand, and attach the finger probe.  After the study, the raw data is downloaded from the watch and scored for apnea events.   For more information: [email or web address]  Patient Testing Instructions:  Do not put battery into the device until bedtime when you are ready to begin the test. Please call the support number if you need assistance after following the instructions below: 24 hour support line- [phone] or [facility] support at [phone] (option 2)  Download the IntelWatchPAT One" app through the google play store or App Store  Be sure to turn on or enable access to bluetooth in settlings on your smartphone/ device  Make sure no other bluetooth devices are on and within the vicinity of your smartphone/ device and WatchPAT watch during testing.  Make sure to leave your smart phone/ device plugged in and charging all night.  When ready for bed:  Follow the instructions step by step in the WatchPAT One App to activate the testing device. For additional instructions, including video instruction, visit the WatchPAT One video on Youtube. You can search for WatchPat One within Youtube (video is 4 minutes and 18 seconds) or enter: https://youtube/watch?[email or web address]_vbiwxE Please note: You will be prompted to enter a Pin to connect via bluetooth when starting the test. The PIN will be assigned to you when you receive the test.  The device is  disposable, but it recommended that you retain the device until you receive a call letting you know the study has been received and the results have been interpreted.  We will let you know if the study did not transmit to [place] properly after the test is completed. You do not need to call us to confirm the receipt of the test.  Please complete the test within 48 hours of receiving PIN.   Frequently Asked Questions:  What is Watch Dennie Bible  one?  A single use fully disposable home sleep apnea testing device and will not need to be returned after completion.  What are the requirements to use WatchPAT one?  The be able to have a successful watchpat one sleep study, you should have your Watch pat one device, your smart phone, watch pat one app, your PIN number and Internet access What type of phone do I need?  You should have a smart phone that uses Android 5.1 and above or any Iphone with IOS 10 and above How can I download the WatchPAT one app?  Based on your device type search for WatchPAT one app either in google play for android devices or APP store for Iphone's Where will I get my PIN for the study?  Your PIN will be provided by your physician's office. It is used for authentication and if you lose/forget your PIN, please reach out to your providers office.  I do not have Internet at home. Can I do WatchPAT one study?  WatchPAT One needs Internet connection throughout the night to be able to transmit the sleep data. You can use your home/local internet or your cellular's data package. However, it is always recommended to use home/local Internet. It is estimated that between 20MB-30MB will be used with each study.However, the application will be looking for space in the phone to start the study.  What happens if I lose internet or bluetooth connection?  During the internet disconnection, your phone will not be able to transmit the sleep data. All the data, will be stored in your phone. As soon as the internet connection is back on, the phone will being sending the sleep data. During the bluetooth disconnection, WatchPAT one will not be able to to send the sleep data to your phone. Data will be kept in the [facility] one until two devices have bluetooth connection back on. As soon as the connection is back on, WatchPAT one will send the sleep data to the phone.  How long do I need to wear the WatchPAT one?  After you start the  study, you should wear the device at least 6 hours.  How far should I keep my phone from the device?  During the night, your phone should be within 15 feet.  What happens if I leave the room for restroom or other reasons?  Leaving the room for any reason will not cause any problem. As soon as your get back to the room, both devices will reconnect and will continue to send the sleep data. Can I use my phone during the sleep study?  Yes, you can use your phone as usual during the study. But it is recommended to put your watchpat one on when you are ready to go to bed.  How will I get my study results?  A soon as you completed your study, your sleep data will be sent to the provider. They will then share the results with you when they are ready.  Follow-Up: At [facility], you and your health needs are our priority.  As part of our continuing mission to provide you with exceptional heart care, we have created designated Provider Care Teams.  These Care Teams include your primary Cardiologist (physician) and Advanced Practice Providers (APPs -  Physician Assistants and Nurse Practitioners) who all work together to provide you with the care you need, when you need it.  We recommend signing up for the patient portal called "MyChart".  Sign up information is provided on this After Visit Summary.  MyChart is used to connect with patients for Virtual Visits (Telemedicine).  Patients are able to view lab/test results, encounter notes, upcoming appointments, etc.  Non-urgent messages can be sent to your provider as well.   To learn more about what you can do with MyChart, go to [email or web address].    Your next appointment:   6 month(s)  The format for your next appointment:   In Person  Provider:   Thomasene Ripple, DO     Adopting a Healthy Lifestyle.  Know what a healthy weight is for you (roughly BMI <25) and aim to maintain this   Aim for 7+ servings of fruits and vegetables daily    65-80+ fluid ounces of water or unsweet tea for healthy kidneys   Limit to max 1 drink of alcohol per day; avoid smoking/tobacco   Limit animal fats in diet for cholesterol and heart health - choose grass fed whenever available   Avoid highly processed foods, and foods high in saturated/trans fats   Aim for low stress - take time to unwind and care for your mental health   Aim for 150 min of moderate intensity exercise weekly for heart health, and weights twice weekly for bone health   Aim for 7-9 hours of sleep daily   When it comes to diets, agreement about the perfect plan isnt easy to find, even among the experts. Experts at the [facility] [employer] developed an idea known as the Healthy Eating Plate. Just imagine a plate divided into logical, healthy portions.   The emphasis is on diet quality:   Load up on vegetables and fruits - one-half of your plate: Aim for color and variety, and remember that potatoes dont count.   Go for whole grains - one-quarter of your plate: Whole wheat, barley, wheat berries, quinoa, oats, brown rice, and foods made with them. If you want pasta, go with whole wheat pasta.   Protein power - one-quarter of your plate: Fish, chicken, beans, and nuts are all healthy, versatile protein sources. Limit red meat.   The diet, however, does go beyond the plate, offering a few other suggestions.   Use healthy plant oils, such as olive, canola, soy, corn, sunflower and peanut. Check the labels, and avoid partially hydrogenated oil, which have unhealthy trans fats.   If youre thirsty, drink water. Coffee and tea are good in moderation, but skip sugary drinks and limit milk and dairy products to one or two daily servings.   The type of carbohydrate in the diet is more important than the amount. Some sources of carbohydrates, such as vegetables, fruits, whole grains, and beans-are healthier than others.   Finally, stay active  Signed, Thomasene Ripple, DO  12/10/2022 3:21 PM    [facility]

## 2022-12-10 NOTE — Patient Instructions (Signed)
Medication Instructions:  Your physician recommends that you continue on your current medications as directed. Please refer to the Current Medication list given to you today.    *If you need a refill on your cardiac medications before your next appointment, please call your pharmacy*   Lab Work: Your physician recommends that you have these labs drawn today: - TSH + FT4/FT3   If you have labs (blood work) drawn today and your tests are completely normal, you will receive your results only by: MyChart Message (if you have MyChart) OR A paper copy in the mail If you have any lab test that is abnormal or we need to change your treatment, we will call you to review the results.   Testing/Procedures:  WatchPAT?  Is a FDA cleared portable home sleep study test that uses a watch and 3 points of contact to monitor 7 different channels, including your heart rate, oxygen saturations, body position, snoring, and chest motion.  The study is easy to use from the comfort of your own home and accurately detect sleep apnea.  Before bed, you attach the chest sensor, attached the sleep apnea bracelet to your nondominant hand, and attach the finger probe.  After the study, the raw data is downloaded from the watch and scored for apnea events.   For more information: [email or web address]  Patient Testing Instructions:  Do not put battery into the device until bedtime when you are ready to begin the test. Please call the support number if you need assistance after following the instructions below: 24 hour support line- [phone] or [facility] support at [phone] (option 2)  Download the IntelWatchPAT One" app through the google play store or App Store  Be sure to turn on or enable access to bluetooth in settlings on your smartphone/ device  Make sure no other bluetooth devices are on and within the vicinity of your smartphone/ device and WatchPAT watch during testing.  Make sure to  leave your smart phone/ device plugged in and charging all night.  When ready for bed:  Follow the instructions step by step in the WatchPAT One App to activate the testing device. For additional instructions, including video instruction, visit the WatchPAT One video on Youtube. You can search for WatchPat One within Youtube (video is 4 minutes and 18 seconds) or enter: https://[email or web address]?[email or web address]_vbiwxE Please note: You will be prompted to enter a Pin to connect via bluetooth when starting the test. The PIN will be assigned to you when you receive the test.  The device is disposable, but it recommended that you retain the device until you receive a call letting you know the study has been received and the results have been interpreted.  We will let you know if the study did not transmit to [place] properly after the test is completed. You do not need to call us to confirm the receipt of the test.  Please complete the test within 48 hours of receiving PIN.   Frequently Asked Questions:  What is Watch Dennie Bible one?  A single use fully disposable home sleep apnea testing device and will not need to be returned after completion.  What are the requirements to use WatchPAT one?  The be able to have a successful watchpat one sleep study, you should have your Watch pat one device, your smart phone, watch pat one app, your PIN number and Internet access What type of phone do I need?  You should have a smart phone that uses Android 5.1 and above  or any Iphone with IOS 10 and above How can I download the WatchPAT one app?  Based on your device type search for WatchPAT one app either in google play for android devices or APP store for Iphone's Where will I get my PIN for the study?  Your PIN will be provided by your physician's office. It is used for authentication and if you lose/forget your PIN, please reach out to your providers office.  I do not have Internet at home. Can I do WatchPAT one study?  WatchPAT One  needs Internet connection throughout the night to be able to transmit the sleep data. You can use your home/local internet or your cellular's data package. However, it is always recommended to use home/local Internet. It is estimated that between 20MB-30MB will be used with each study.However, the application will be looking for space in the phone to start the study.  What happens if I lose internet or bluetooth connection?  During the internet disconnection, your phone will not be able to transmit the sleep data. All the data, will be stored in your phone. As soon as the internet connection is back on, the phone will being sending the sleep data. During the bluetooth disconnection, WatchPAT one will not be able to to send the sleep data to your phone. Data will be kept in the [facility] one until two devices have bluetooth connection back on. As soon as the connection is back on, WatchPAT one will send the sleep data to the phone.  How long do I need to wear the WatchPAT one?  After you start the study, you should wear the device at least 6 hours.  How far should I keep my phone from the device?  During the night, your phone should be within 15 feet.  What happens if I leave the room for restroom or other reasons?  Leaving the room for any reason will not cause any problem. As soon as your get back to the room, both devices will reconnect and will continue to send the sleep data. Can I use my phone during the sleep study?  Yes, you can use your phone as usual during the study. But it is recommended to put your watchpat one on when you are ready to go to bed.  How will I get my study results?  A soon as you completed your study, your sleep data will be sent to the provider. They will then share the results with you when they are ready.      Follow-Up: At [facility], you and your health needs are our priority.  As part of our continuing mission to provide you with exceptional heart care, we  have created designated Provider Care Teams.  These Care Teams include your primary Cardiologist (physician) and Advanced Practice Providers (APPs -  Physician Assistants and Nurse Practitioners) who all work together to provide you with the care you need, when you need it.  We recommend signing up for the patient portal called "MyChart".  Sign up information is provided on this After Visit Summary.  MyChart is used to connect with patients for Virtual Visits (Telemedicine).  Patients are able to view lab/test results, encounter notes, upcoming appointments, etc.  Non-urgent messages can be sent to your provider as well.   To learn more about what you can do with MyChart, go to [email or web address].    Your next appointment:   6 month(s)  The format for your next appointment:   In Person  Provider:   Thomasene Ripple, DO

## 2022-12-11 LAB — TSH+T4F+T3FREE
Free T4: 0.9 ng/dL (ref 0.82–1.77)
T3, Free: 2.2 pg/mL (ref 2.0–4.4)
TSH: 1.51 u[IU]/mL (ref 0.450–4.500)

## 2022-12-11 NOTE — Progress Notes (Signed)
Patient agreement reviewed and signed on 12/10/2022.  WatchPAT issued to patient on 12/10/2022 by Brunetta Genera, CMA. Patient aware to not open the WatchPAT box until contacted with the activation PIN. Patient profile initialized in CloudPAT on 12/11/2022 by Brunetta Genera, CMA. Device serial number: [id number]

## 2022-12-24 IMAGING — CR DG CHEST 2V
2 series · 2 of 2 positions shown · non-contrast
Comparison: None.

CLINICAL DATA: Chest pain.

EXAM:
CHEST - 2 VIEW

[w chest pa]
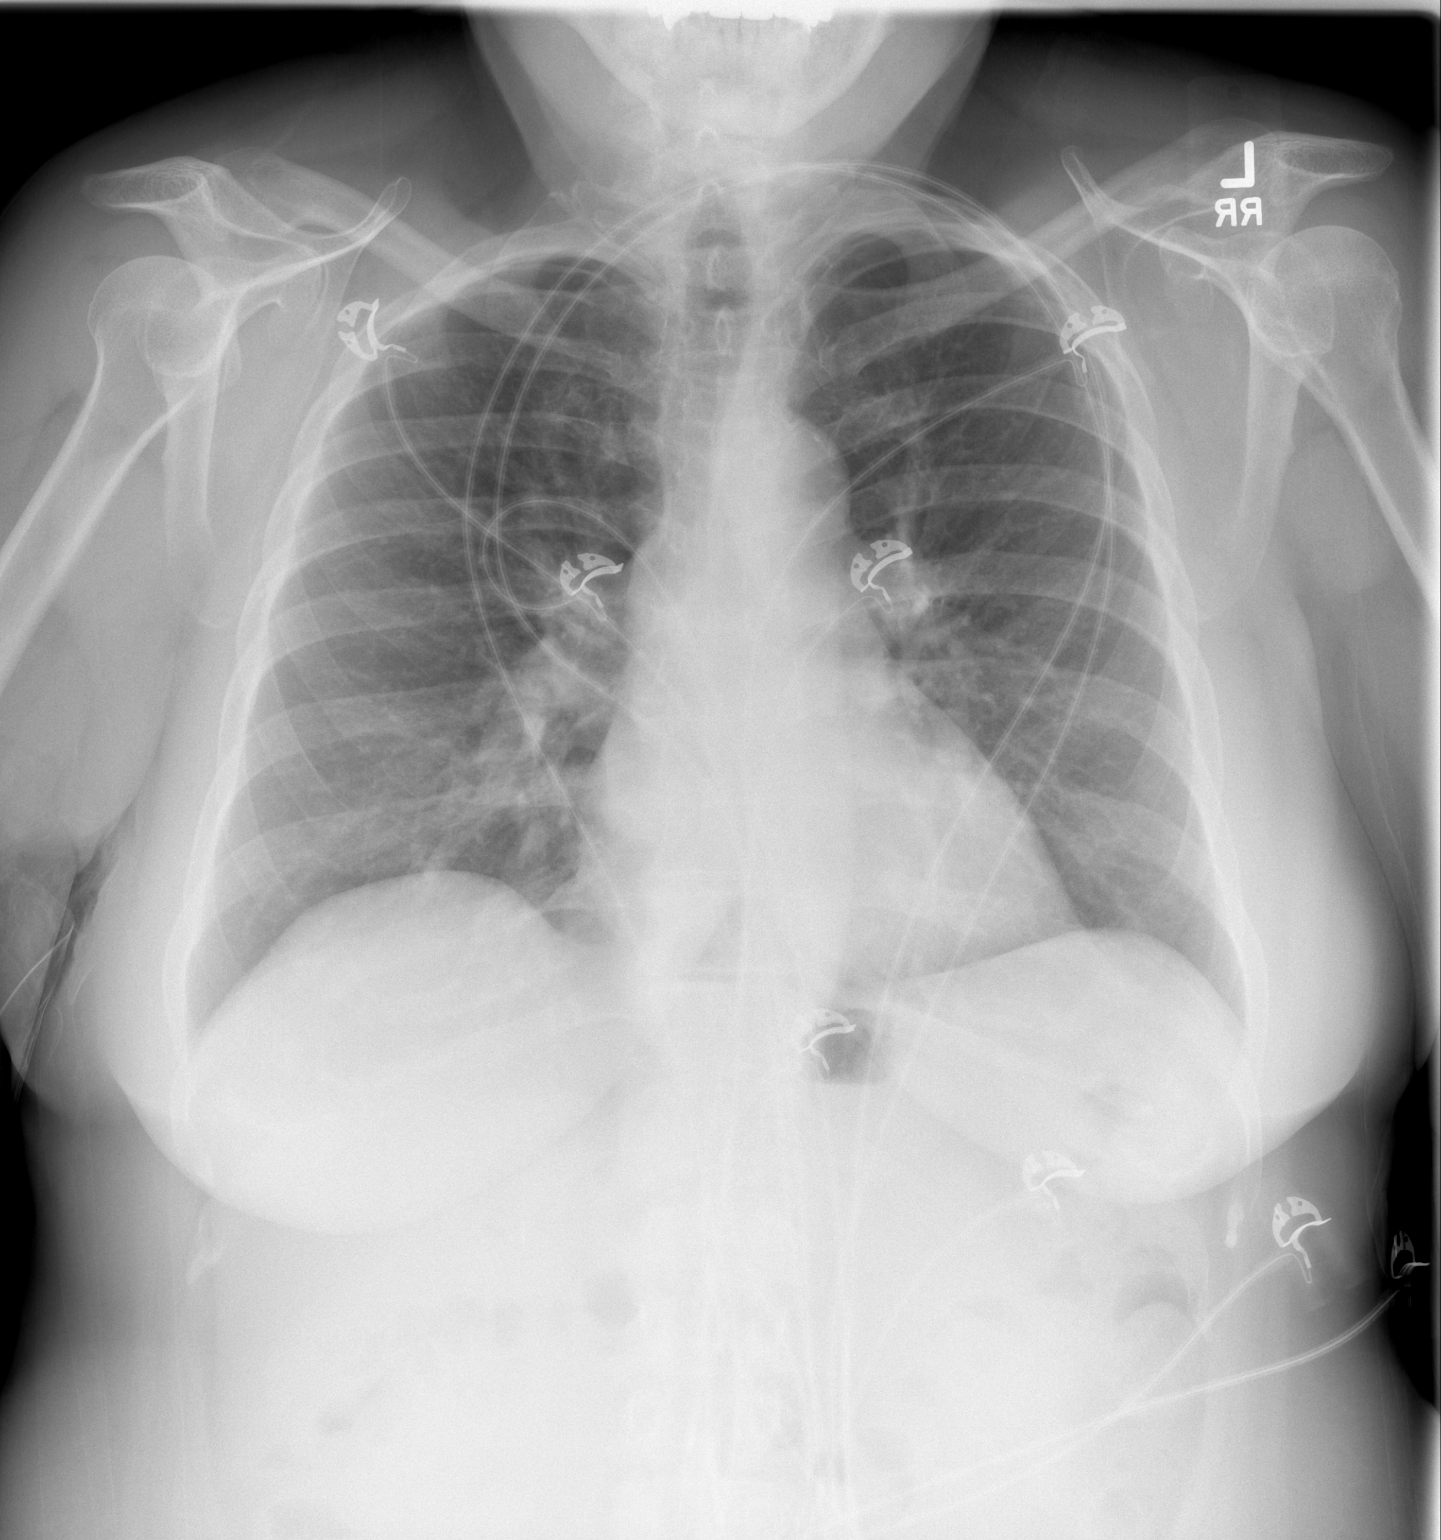

[w chest lat]
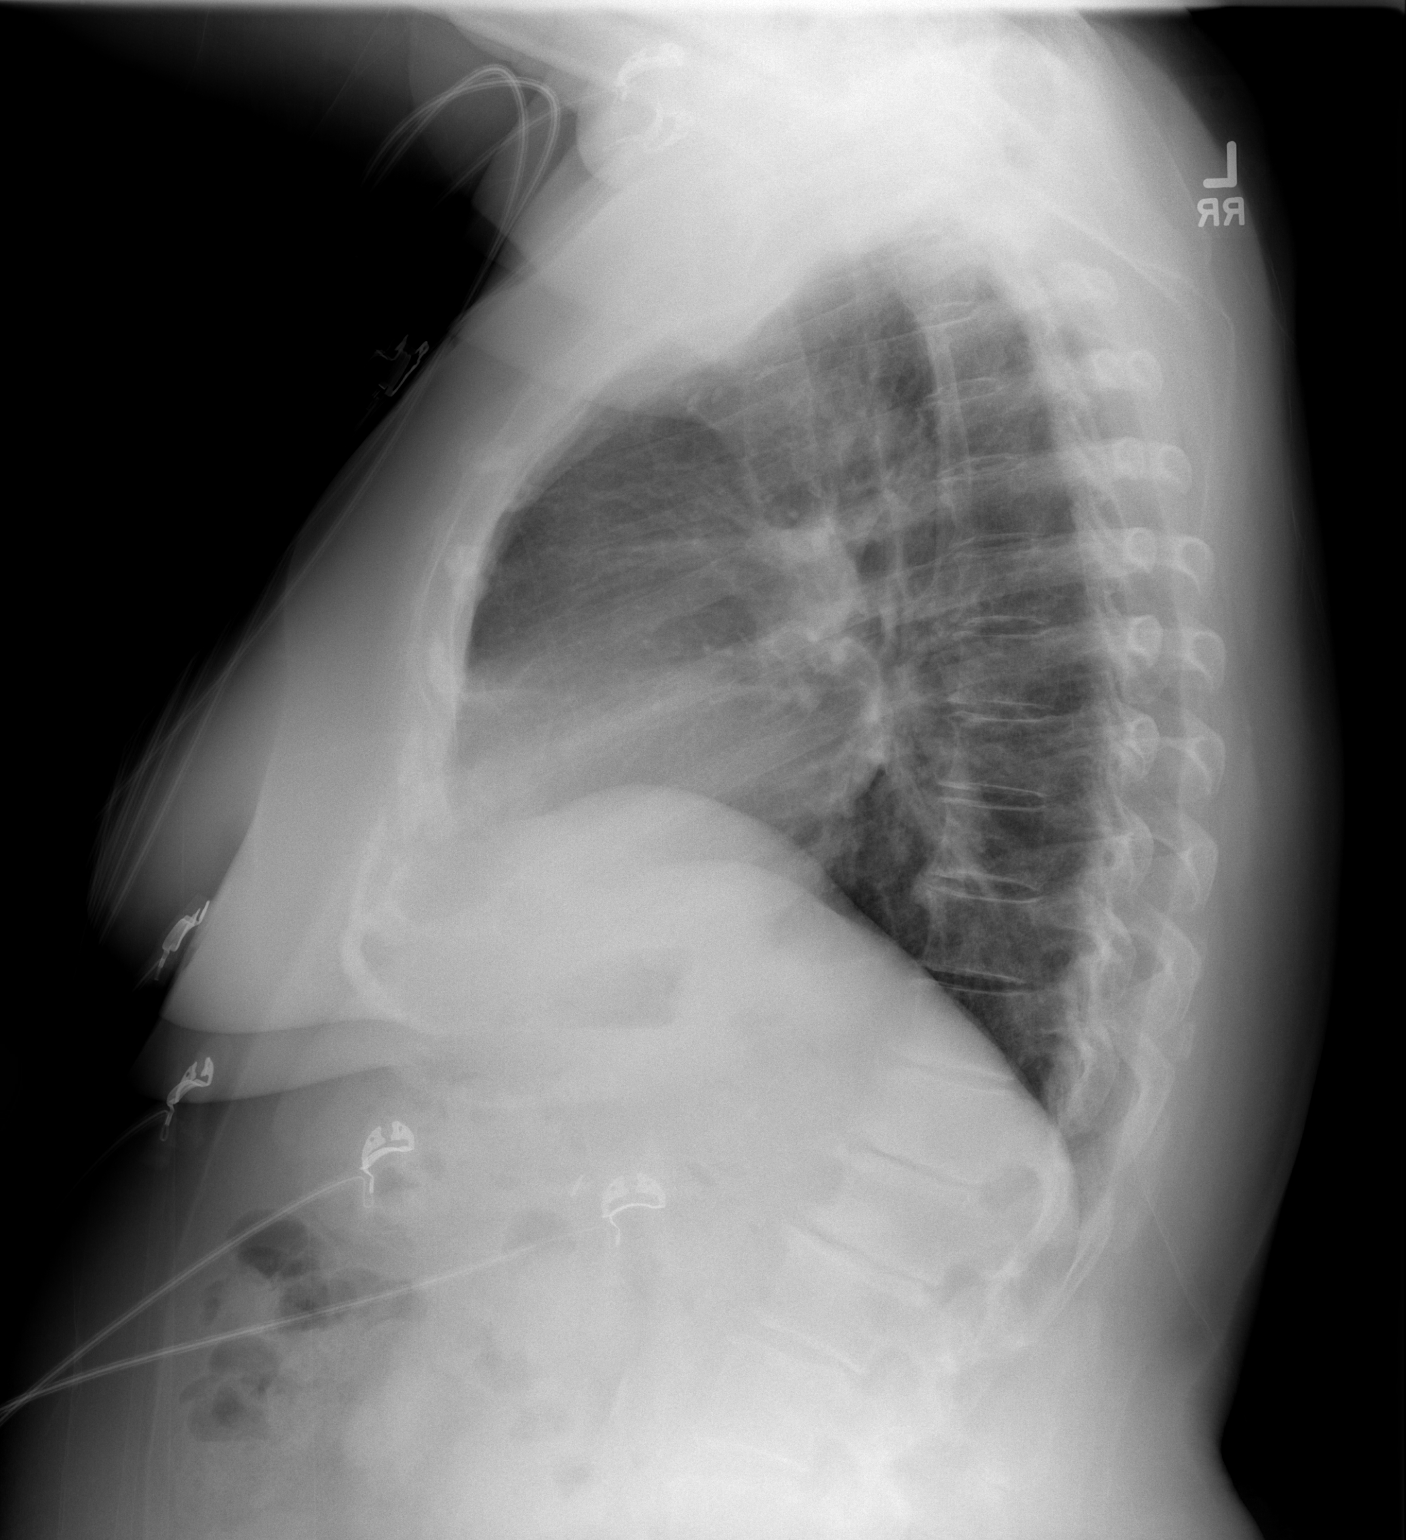

[2 of 2 positions shown; findings below may reference images not displayed]

FINDINGS: Both lungs are clear. Heart and mediastinum are within normal
limits. Negative for a pneumothorax. Atherosclerotic calcifications
at the aortic arch. Degenerative changes in the upper lumbar spine
region.
IMPRESSION: No acute cardiopulmonary disease.

## 2022-12-24 IMAGING — CT CT HEAD W/O CM
3 series · 16 of 47 positions shown, 19 images · non-contrast
Comparison: Brain MRI 08/25/2018

CLINICAL DATA: Headache, new or worsening.  Jaw pain.

EXAM:
CT HEAD WITHOUT CONTRAST
TECHNIQUE: Contiguous axial images were obtained from the base of the skull
through the vertex without intravenous contrast.

[Series 2: head wo · axial · 0.43mm/px · z∈[-164,-34]mm · 10 of 32 slices shown, 13 images]
[im 3/32  brain]
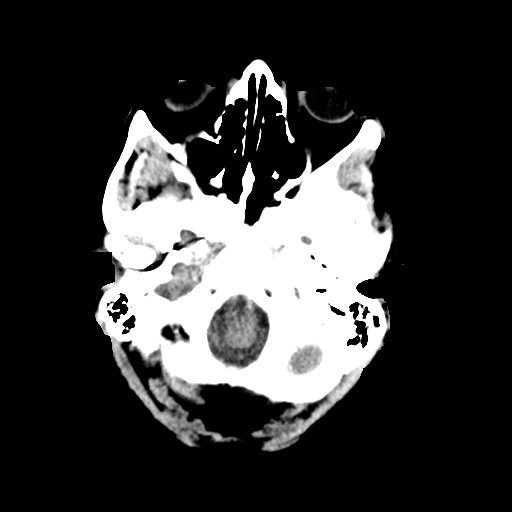
[im 3/32  bone]
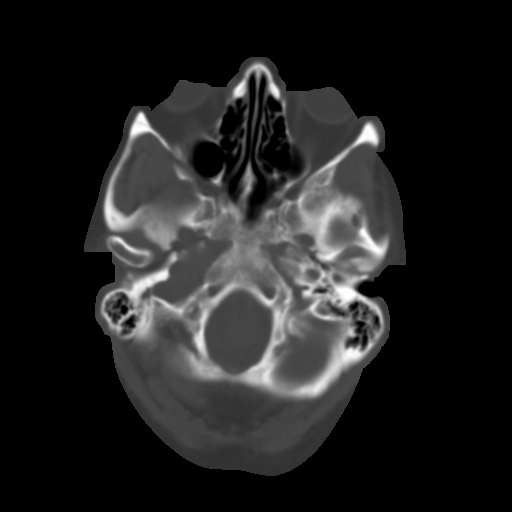
[im 6/32  brain]
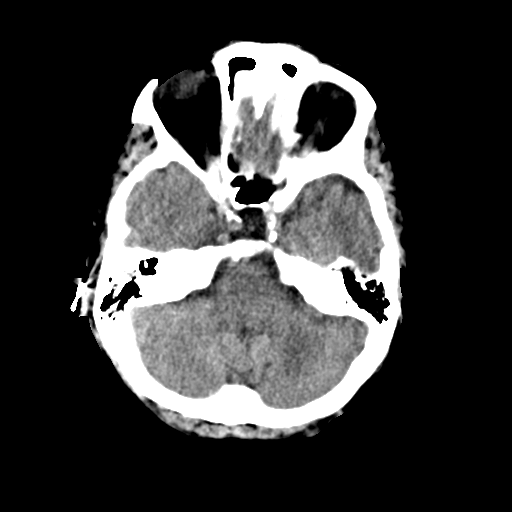
[im 9/32  brain]
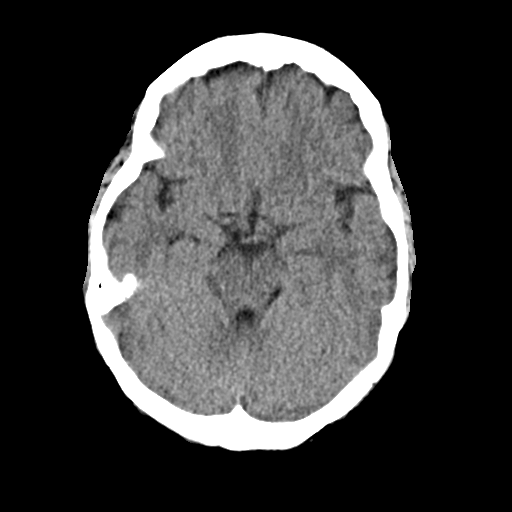
[im 11/32  brain]
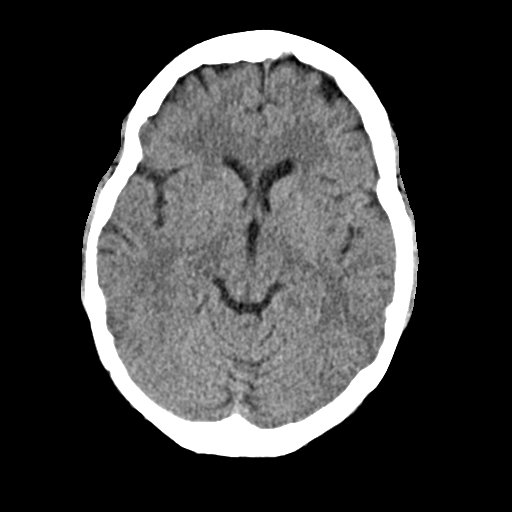
[im 14/32  brain]
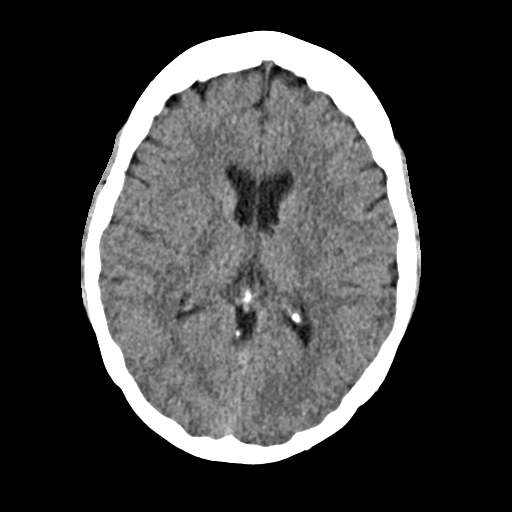
[im 14/32  bone]
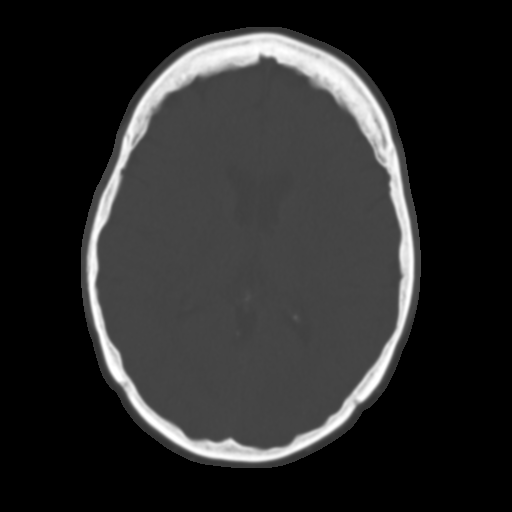
[im 18/32  brain]
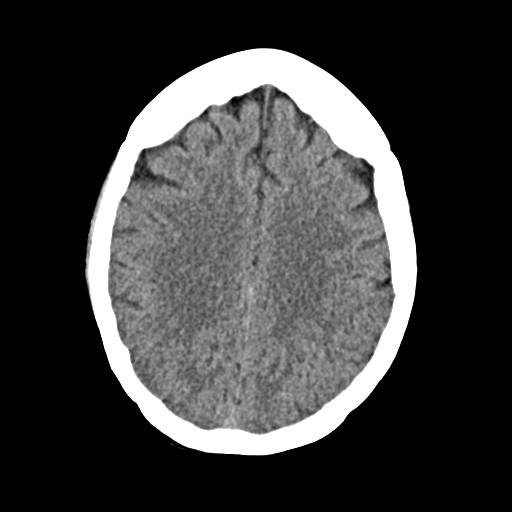
[im 21/32  brain]
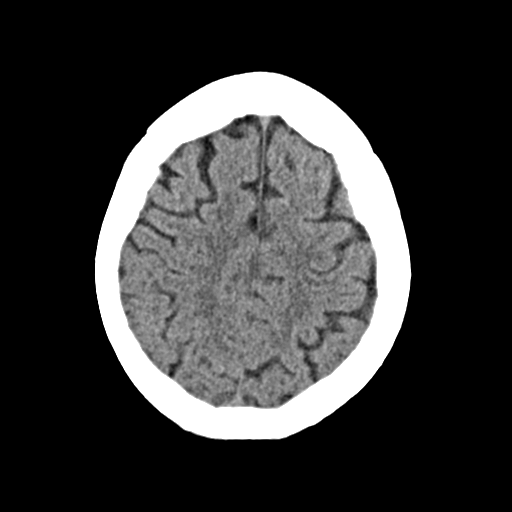
[im 24/32  brain]
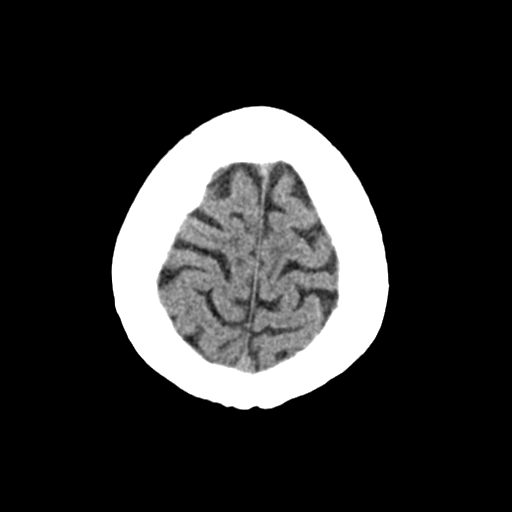
[im 26/32  brain]
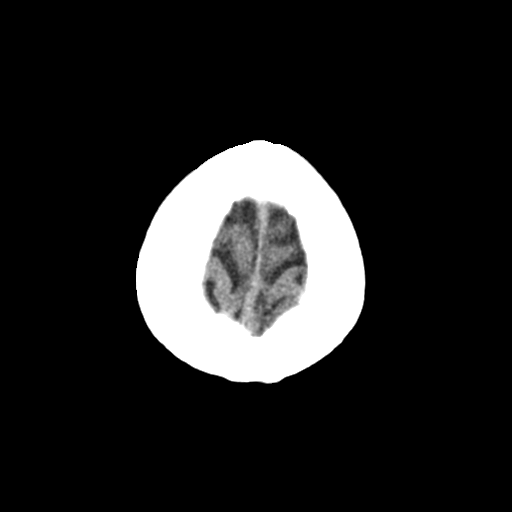
[im 26/32  bone]
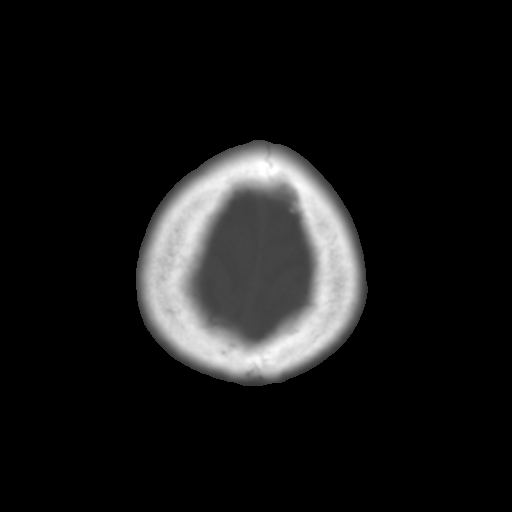
[im 29/32  brain]
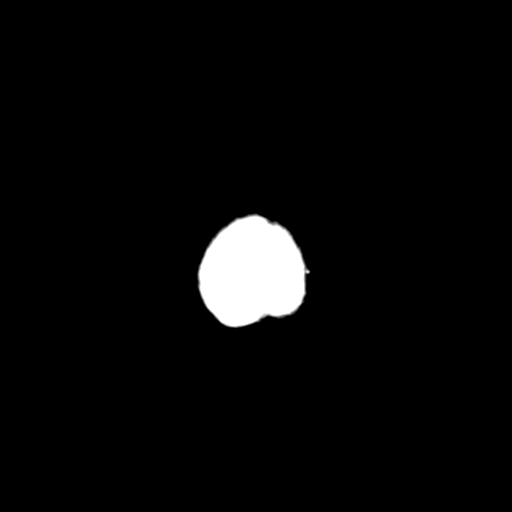

[Series 4: coronal soft · coronal · 0.31mm/px · 3 of 66 slices shown]
[im 22/66  brain]
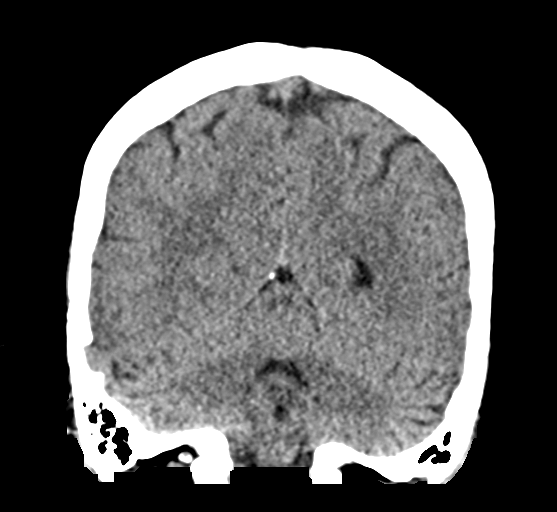
[im 29/66  brain]
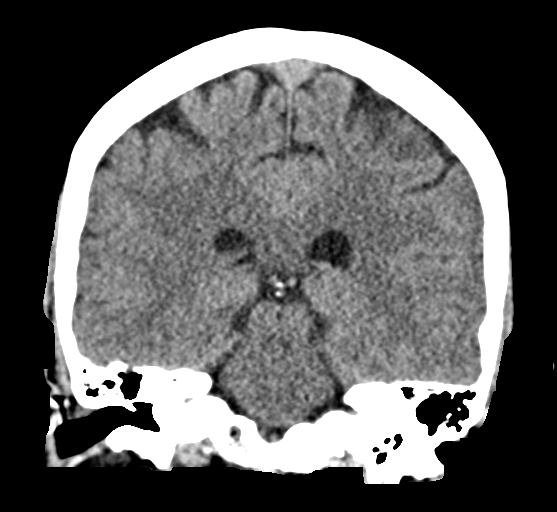
[im 37/66  brain]
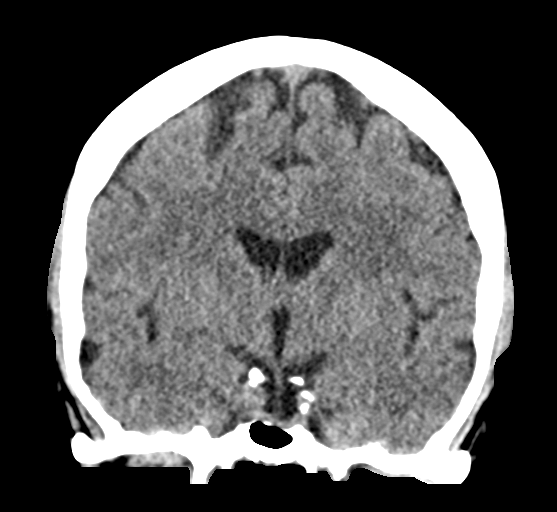

[Series 5: sag soft · sagittal · 0.31mm/px · 3 of 54 slices shown]
[im 18/54  brain]
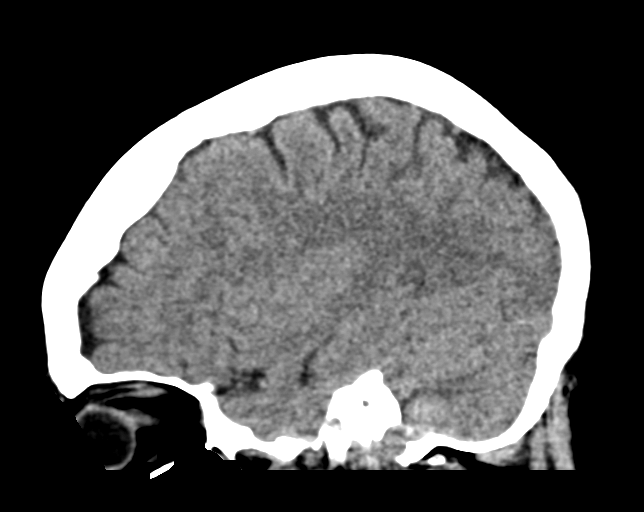
[im 27/54  brain]
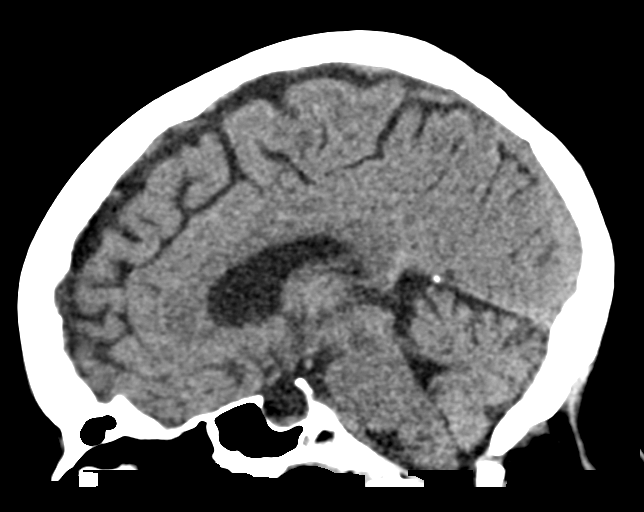
[im 36/54  brain]
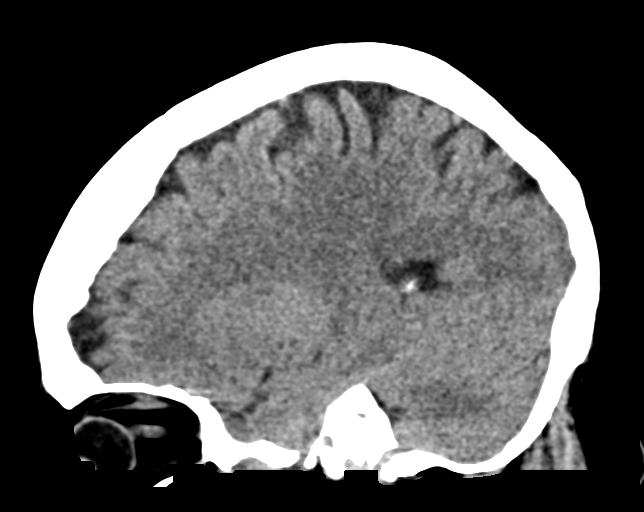

[16 of 47 positions shown; findings below may reference images not displayed]

FINDINGS: Brain: No evidence of acute infarction, hemorrhage, hydrocephalus,
extra-axial collection or mass lesion/mass effect. Again noted is an
empty sella which is similar to the previous MR findings.

Vascular: No hyperdense vessel or unexpected calcification.

Skull: Normal. Negative for fracture or focal lesion. Normal
appearance of the TMJ joints.

Sinuses/Orbits: No acute finding.

Other: None.
IMPRESSION: No acute intracranial abnormality.

## 2023-01-05 ENCOUNTER — Other Ambulatory Visit: Payer: Self-pay | Admitting: *Deleted

## 2023-01-05 DIAGNOSIS — E785 Hyperlipidemia, unspecified: Secondary | ICD-10-CM

## 2023-01-20 ENCOUNTER — Encounter: Payer: Self-pay | Admitting: Cardiology

## 2023-01-22 ENCOUNTER — Telehealth: Payer: Self-pay | Admitting: *Deleted

## 2023-01-22 NOTE — Telephone Encounter (Signed)
Ordering provider: Thomasene Ripple Associated diagnoses: R53.83 WatchPAT PA obtained on 01/22/2023 by Latrelle Dodrill, CMA. Authorization: No; tracking ID [id number]  APPROVED-Authorization #[id number] Patient notified of PIN ([id number]) on 01/22/2023 -04/22/2023 via Notification Method: phone.

## 2023-01-27 ENCOUNTER — Ambulatory Visit: Payer: PPO | Attending: Cardiology

## 2023-01-27 DIAGNOSIS — R5383 Other fatigue: Secondary | ICD-10-CM

## 2023-01-30 ENCOUNTER — Other Ambulatory Visit: Payer: Self-pay | Admitting: Medical Genetics

## 2023-01-30 DIAGNOSIS — Z006 Encounter for examination for normal comparison and control in clinical research program: Secondary | ICD-10-CM

## 2023-02-10 ENCOUNTER — Ambulatory Visit: Payer: PPO | Admitting: Gastroenterology

## 2023-02-10 ENCOUNTER — Encounter: Payer: Self-pay | Admitting: Gastroenterology

## 2023-02-10 VITALS — BP 140/82 | HR 66 | Ht 62.0 in | Wt 188.0 lb

## 2023-02-10 DIAGNOSIS — K5900 Constipation, unspecified: Secondary | ICD-10-CM | POA: Diagnosis not present

## 2023-02-10 DIAGNOSIS — R1310 Dysphagia, unspecified: Secondary | ICD-10-CM

## 2023-02-10 DIAGNOSIS — R194 Change in bowel habit: Secondary | ICD-10-CM

## 2023-02-10 NOTE — Patient Instructions (Signed)
_______________________________________________________  If your blood pressure at your visit was 140/90 or greater, please contact your primary care physician to follow up on this. _______________________________________________________  If you are age 71 or older, your body mass index should be between 23-30. Your Body mass index is 34.39 kg/m. If this is out of the aforementioned range listed, please consider follow up with your Primary Care Provider. _______________________________________________________  The [facility] providers would like to encourage you to use [facility] to communicate with providers for non-urgent requests or questions.  Due to long hold times on the telephone, sending your provider a message by [facility] may be a faster and more efficient way to get a response.  Please allow 48 business hours for a response.  Please remember that this is for non-urgent requests.  _______________________________________________________  Bonita Quin have been scheduled for a Barium Esophogram at [facility] (1st floor of the hospital) on 02-16-23 at 10am. Please arrive 30 minutes prior to your appointment for registration. Make certain not to have anything to eat or drink 4 hours prior to your test. If you need to reschedule for any reason, please contact radiology at [phone] to do so. __________________________________________________________________  A barium swallow is an examination that concentrates on views of the esophagus. This tends to be a double contrast exam (barium and two liquids which, when combined, create a gas to distend the wall of the oesophagus) or single contrast (non-ionic iodine based). The study is usually tailored to your symptoms so a good history is essential. Attention is paid during the study to the form, structure and configuration of the esophagus, looking for functional disorders (such as aspiration, dysphagia, achalasia, motility and  reflux)  EXAMINATION You may be asked to change into a gown, depending on the type of swallow being performed. A radiologist and radiographer will perform the procedure. The radiologist will advise you of the type of contrast selected for your procedure and direct you during the exam. You will be asked to stand, sit or lie in several different positions and to hold a small amount of fluid in your mouth before being asked to swallow while the imaging is performed .In some instances you may be asked to swallow barium coated marshmallows to assess the motility of a solid food bolus.  The exam can be recorded as a digital or video fluoroscopy procedure.  POST PROCEDURE It will take 1-2 days for the barium to pass through your system. To facilitate this, it is important, unless otherwise directed, to increase your fluids for the next 24-48hrs and to resume your normal diet.   This test typically takes about 30 minutes to perform. __________________________________________________________________________________  Due to recent changes in healthcare laws, you may see the results of your imaging and laboratory studies on MyChart before your provider has had a chance to review them.  We understand that in some cases there may be results that are confusing or concerning to you. Not all laboratory results come back in the same time frame and the provider may be waiting for multiple results in order to interpret others.  Please give [place] 48 hours in order for your provider to thoroughly review all the results before contacting the office for clarification of your results.   It has been recommended that you complete a bowel purge (to clean out your bowels). Please do the following: Purchase a bottle of Miralax over the counter as well as a box of 5 mg dulcolax tablets. Take 4 dulcolax tablets. Wait 1  hour. You will then drink 6-8 capfuls of Miralax mixed in an adequate amount of water/juice/gatorade (you may  choose which of these liquids to drink) over the next 2-3 hours. You should expect results within 1 to 6 hours after completing the bowel purge.  It was a pleasure to see you today!  Vito Cirigliano, D.O.

## 2023-02-10 NOTE — Telephone Encounter (Signed)
Test completed

## 2023-02-10 NOTE — Progress Notes (Signed)
Chief Complaint:    Dysphagia, constipation  GI History: 73 y.o. female with a history of CAD, HTN, dyslipidemia, asthma, history of CVA, GERD, cholecystectomy, anxiety, depression, tremor, follows in the GI clinic for the following:  1) Hepatic cyst.  Incidentally noted on imaging for other reasons -05/04/2017: CT abdomen/pelvis: Liver appears slightly decreased in attenuation.  Low-attenuation lesions in the dome of the liver measuring up to 2.5 cm (likely cysts).  No duct dilatation - 01/28/2021: CT coronary: Moderate size hiatal hernia.  3.6 x 2.6 simple hepatic cyst - 06/03/2021: 3.8 x 3.2 x 2.6 cm lobulated cyst in left lobe of the liver (measuring 3.2 cm maximum diameter on CT in 05/2017) - 07/22/2021: Evaluated in the GI clinic.  Patient opted for observation alone  Normal liver enzymes in 03/2021. No personal hx of liver disease. No abdominal pain, jaundice, icteric sclera, ascites.  2) GERD 3) Dysphagia Longstanding history of reflux.  Index symptoms of heartburn, regurgitation.  More recently also with episodic solid food dysphagia.  No history of food impactions.  Symptoms worse with tomato-based sauces, oranges, ice tea.  Avoid eating within 2-3 hours of bedtime and sleeps with HOB elevated.  Reflux generally controlled with Protonix 40 mg bid, but still uses Tums on demand for breakthrough heartburn. - 01/21/2019: Evaluation by [facility].  Increased pantoprazole to 40 mg bid and continued Zantac 150 mg - 01/2019: EGD: Hiatal hernia, fundic gland polyps - 07/22/2021: Initial appointment in this GI clinic - 07/30/2021: Barium esophagram: Moderate size hiatal hernia, moderate to large volume gastroesophageal reflux to the level of the mid esophagus.  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 - 08/22/2021: EGD: 4 cm HH, LA Grade A esophagitis.  Esophagus dilated with 17 mm TTS balloon without mucosal rent.  Biopsies negative for EOE.  Multiple benign fundic gland  polyps. - 09/06/2021: Esophageal Manometry: Normal.  70% of swallows were peristaltic with normal latency and DCI.  30% of swallows with fragmented peristalsis.  No manometric HH.  Bolus clearance on 6/10 swallows. Rapid sequence swallows was normal - 10/22/2021: GES: Normal - 11/13/2021: Robotic assisted paraesophageal hernia repair, mesh reinforcement of incarcerated hiatal hernia, and partial toupee fundoplication     Endoscopic History: - EGD (3/14): 5 cm hiatal hernia - Colonoscopy (05/2012): Normal colon with biopsies negative for microscopic colitis.  Grade 1 internal hemorrhoids, normal TI.  Recommended repeat in 5 years due to family history of colon polyps - EGD (01/2019; report viewed on patient's phone via her MyChart): Medium HH, mild gastritis, 6 pedunculated fundic gland polyps ranging 5-10 mm - Colonoscopy (01/2019;  report viewed on patient's phone via her MyChart): Transverse polyps 5-6 mm (HP), 4 mm rectal polyp (TA), IH. Repeat 5 years - EGD (07/2021): 4 cm HH, LA Grade A esophagitis.  Empiric dilation 17 mm TTS balloon.  Esophageal biopsies negative for EOE.  Fundic gland polyps.  HPI:     Patient is a 72 y.o. female presenting to the Gastroenterology Clinic for follow-up.  Last seen by me on 09/18/2021.  Main issue at that time was breakthrough reflux despite Protonix 40 mg twice daily.  Also with ongoing dysphagia.  Discussed role/utility of hiatal hernia repair and fundoplication for refractory reflux and high suspicion of dysphagia 2/2 reflux burnout.  She was seen by Dr. Michaell Cowing in the surgical clinic 10/07/2021 and underwent robotic assisted paraesophageal hernia repair, mesh reinforcement of incarcerated hiatal hernia, and partial toupee fundoplication on 11/13/2021.  Was seen in the Cardiology Clinic  in 11/2022 for follow-up of HTN, HLD, mild CAD and evaluation of fatigue.  Was referred for sleep study.  Reviewed most recent labs from 11/12/2022: Normal CBC, CMP, A1c 5.6%.  No  new imaging for review.  Main issue today is dysphagia and constipation.   After hernia repair, did have improvement in dysphagia. However, sxs have started to recur in the last 6 months. Worse with meats, bread. Will have to regurgitate food back out. No breakthrough heartburn, regurgitation. Points to mid sternum.  No food impactions.  Separately, c/o constipation, with up to 6-7 days between BM. Has tried high fiber diet, flax seed, prune juice, without much improvement. Has tried OTC laxatives and Mag citrate. Sxs have been present for several months. No clear provoking issue, medication changes, dietary modifications, etc. Drinks 120+ oz of water/day.  No hematochezia.    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     ALPRAZolam (XANAX) 0.25 MG tablet Take 0.25 mg by mouth 3 (three) times daily as needed for anxiety.     aspirin EC 81 MG tablet Take 1 tablet (81 mg total) by mouth daily. Swallow whole. 90 tablet 3   CALCIUM CITRATE-VITAMIN D PO Take 2 tablets by mouth at bedtime.     cetirizine (ZYRTEC) 10 MG tablet Take 10 mg by mouth daily as needed for allergies.     Cholecalciferol 25 MCG (1000 UT) tablet Take 1,000 Units by mouth daily.      escitalopram (LEXAPRO) 20 MG tablet Take 20 mg by mouth at bedtime.     Evolocumab (REPATHA SURECLICK) 140 MG/ML SOAJ Inject 140 mg into the skin every 14 (fourteen) days. 2 mL 11   ezetimibe (ZETIA) 10 MG tablet Take 10 mg by mouth at bedtime.     gabapentin (NEURONTIN) 800 MG tablet Take 800 mg by mouth at bedtime.     latanoprost (XALATAN) 0.005 % ophthalmic solution Place 1 drop into both eyes at bedtime.     losartan (COZAAR) 100 MG tablet Take 100 mg by mouth daily.     Magnesium 250 MG TABS Take 250 mg by mouth daily.     methocarbamol (ROBAXIN) 500 MG tablet Take 500 mg by mouth every 8 (eight) hours as needed for muscle spasms.     montelukast (SINGULAIR) 10 MG tablet Take 10 mg by mouth at bedtime.     Multiple Vitamin (MULTI-VITAMIN) tablet Take 1 tablet by mouth daily.     primidone (MYSOLINE) 50 MG tablet Take 100 mg by mouth at bedtime.     tiZANidine (ZANAFLEX) 2 MG tablet Take 2  mg by mouth every 8 (eight) hours as needed for muscle spasms.     vitamin B-12 (CYANOCOBALAMIN) 500 MCG tablet Take 500 mcg by mouth daily.     zonisamide (ZONEGRAN) 100 MG capsule Take 100 mg by mouth at bedtime.     No current facility-administered medications for this visit.    Physical Exam:     BP (!) 140/82   Pulse 66   Ht 5\' 2"  (1.575 m)   Wt 188 lb (85.3 kg)   BMI 34.39 kg/m   GENERAL:  Pleasant female in NAD PSYCH: : Cooperative, normal affect EENT:  conjunctiva pink, mucous membranes moist, neck supple without masses CARDIAC:  RRR, no murmur heard, no peripheral edema PULM: Normal respiratory effort, lungs CTA bilaterally, no wheezing ABDOMEN:  Nondistended, soft, nontender. No obvious masses, no hepatomegaly,  normal bowel sounds SKIN:  turgor, no lesions seen Musculoskeletal:  Normal muscle tone, normal strength NEURO: Alert and oriented x 3, no focal neurologic deficits   IMPRESSION and PLAN:    1) Dysphagia Does have a history of dysphagia, which was thought largely 2/2  reflux burnout.  Did well with hiatal hernia repair and antireflux surgery, and has had resolution of reflux since surgery.  Has slowly had return of dysphagia over the last 6 months or so, pointing to mid sternum.  Discussed DDx today with plan for the following: - Barium esophagram with barium tablet to evaluate for motility, luminal narrowing, stricture - Depending on esophagram findings, may consider EGD with dilation and/or biopsies as appropriate - Continue cutting food into small pieces, chewing thoroughly, and plenty of fluids with meals - Discussed the possibility of esophageal manometry depending on results above  2) Constipation 3) Change in bowel habits  New onset constipation over the last few months without appreciable response despite appropriate dietary modifications, laxatives, stool softeners, and good hydration.  Discussed DDx to include medication ADR, dysmotility, dyssynergia, etc. - MiraLAX purge prep then MiraLAX 1 cap daily and can hopefully titrate off - Depending on response, may need to do colonoscopy sooner than scheduled to evaluate for mucosal/luminal pathology - If no response to MiraLAX purge, plan for either sits marker study or ARM - Continue hydration  4) Hepatic cyst Simple hepatic cyst with stable size on CT in 2019, CT 12/2020, and  RUQ [place] in 05/2021.  Previously discussed role/utility of further imaging to include MRI Liver protocol in 6-12 months.  Based on overall size stability and benign appearance, she opted for observation alone.   5) History of colon polyps - Tentative plan for repeat colonoscopy in 01/2024 for ongoing polyp surveillance.  However, may need to expedite a bit for evaluation of constipation as above.  6) Reflux - Resolved after hiatal hernia pair and fundoplication          Verlin Dike Nattalie Santiesteban ,DO, FACG 02/10/2023, 1:26 PM

## 2023-02-16 ENCOUNTER — Other Ambulatory Visit (HOSPITAL_COMMUNITY): Payer: PPO

## 2023-02-17 ENCOUNTER — Ambulatory Visit (HOSPITAL_COMMUNITY)
Admission: RE | Admit: 2023-02-17 | Discharge: 2023-02-17 | Disposition: A | Discharge status: Home / Self Care | Payer: PPO | Source: Ambulatory Visit | Attending: Gastroenterology | Admitting: Gastroenterology

## 2023-02-17 DIAGNOSIS — R1310 Dysphagia, unspecified: Secondary | ICD-10-CM | POA: Insufficient documentation

## 2023-02-18 ENCOUNTER — Other Ambulatory Visit: Payer: Self-pay

## 2023-02-18 MED ORDER — PANTOPRAZOLE SODIUM 40 MG PO TBEC: 40.0000 mg | DELAYED_RELEASE_TABLET | Freq: Two times a day (BID) | ORAL | 3 refills | Status: DC

## 2023-02-20 ENCOUNTER — Encounter: Payer: Self-pay | Admitting: Gastroenterology

## 2023-02-20 MED ORDER — ONDANSETRON HCL 4 MG PO TABS: 4.0000 mg | ORAL_TABLET | Freq: Four times a day (QID) | ORAL | 3 refills | Status: DC | PRN

## 2023-02-24 LAB — NMR, LIPOPROFILE
Cholesterol, Total: 166 mg/dL (ref 100–199)
HDL Particle Number: 45.5 umol/L (ref >=30.5)
HDL-C: 92 mg/dL (ref >39)
LDL Particle Number: 432 nmol/L (ref <1000)
LDL Size: 21.5 nmol (ref >20.5)
LDL-C (NIH Calc): 58 mg/dL (ref 0–99)
LP-IR Score: 35 (ref <=45)
Small LDL Particle Number: <90 nmol/L (ref <=527)
Triglycerides: 91 mg/dL (ref 0–149)

## 2023-03-03 ENCOUNTER — Telehealth: Payer: Self-pay | Admitting: Cardiology

## 2023-03-03 NOTE — Telephone Encounter (Signed)
 Patient would like a call back to discuss sleep study results.

## 2023-03-06 ENCOUNTER — Encounter: Payer: Self-pay | Admitting: Internal Medicine

## 2023-03-06 ENCOUNTER — Other Ambulatory Visit (HOSPITAL_COMMUNITY): Payer: Self-pay

## 2023-03-06 ENCOUNTER — Ambulatory Visit: Payer: PPO | Attending: Cardiology | Admitting: Internal Medicine

## 2023-03-06 VITALS — BP 129/70 | HR 61 | Ht 62.0 in | Wt 186.0 lb

## 2023-03-06 DIAGNOSIS — R931 Abnormal findings on diagnostic imaging of heart and coronary circulation: Secondary | ICD-10-CM

## 2023-03-06 DIAGNOSIS — E785 Hyperlipidemia, unspecified: Secondary | ICD-10-CM | POA: Diagnosis not present

## 2023-03-06 DIAGNOSIS — T466X5A Adverse effect of antihyperlipidemic and antiarteriosclerotic drugs, initial encounter: Secondary | ICD-10-CM | POA: Diagnosis not present

## 2023-03-06 DIAGNOSIS — M791 Myalgia, unspecified site: Secondary | ICD-10-CM | POA: Diagnosis not present

## 2023-03-06 NOTE — Telephone Encounter (Signed)
Correction: Per Dr. Mayford Knife, "Poor PAT amplitude on [facility] HST >> needs to be repeating with teaching on how to make sure finger is secure in the device ."

## 2023-03-06 NOTE — Patient Instructions (Signed)
Medication Instructions:  NO CHANGES  *If you need a refill on your cardiac medications before your next appointment, please call your pharmacy*   Lab Work: FASTING NMR lipoprofile in 1 year -- before next appointment    Follow-Up: At [facility], you and your health needs are our priority.  As part of our continuing mission to provide you with exceptional heart care, we have created designated Provider Care Teams.  These Care Teams include your primary Cardiologist (physician) and Advanced Practice Providers (APPs -  Physician Assistants and Nurse Practitioners) who all work together to provide you with the care you need, when you need it.  We recommend signing up for the patient portal called "MyChart".  Sign up information is provided on this After Visit Summary.  MyChart is used to connect with patients for Virtual Visits (Telemedicine).  Patients are able to view lab/test results, encounter notes, upcoming appointments, etc.  Non-urgent messages can be sent to your provider as well.   To learn more about what you can do with MyChart, go to [email or web address].    Your next appointment:   12 months with Dr. Rennis Golden

## 2023-03-06 NOTE — Progress Notes (Signed)
Virtual Visit via Video Note   Because of Christy Jenkins's co-morbid illnesses, she is at least at moderate risk for complications without adequate follow up.  This format is felt to be most appropriate for this patient at this time.  All issues noted in this document were discussed and addressed.  A limited physical exam was performed with this format.  Please refer to the patient's chart for her consent to telehealth for [facility].      Date:  03/06/2023   ID:  Christy Jenkins, DOB Aug 25, 1950, MRN [id number] The patient was identified using 2 identifiers.  Evaluation Performed:  Follow-Up Visit  Patient Location:  [address] [place] [address]  Provider location:   [address] [place], [place] [address]  PCP:  Christy Hoit, MD  Cardiologist:  Christy Ripple, DO Electrophysiologist:  None   Chief Complaint:  Follow-up dyslipidemia  History of Present Illness:    Christy Jenkins is a 72 y.o. female who presents via audio/video conferencing for a telehealth visit today.  This is a pleasant female kindly referred by Dr. Servando Salina for evaluation management of dyslipidemia.  Recently she had been having some symptoms concerning for angina and underwent Neri CT angiography, which demonstrated extensive coronary calcium with a calcium score of 939, 97th percentile for age and sex matched controls.  FFR fortunately was negative however aggressive cardiovascular risk reduction was recommended.  Unfortunately, she cannot tolerate statins.  In the past she has tried "almost every statin".  This was met with severe muscle aches which occurred within days of starting the medication and seem to resolve after stopping it.  Subsequently she was placed on ezetimibe however her lipids remain significantly elevated.  Recent total cholesterol was 236, triglycerides 90 HDL 87 LDL 131.  09/25/2021  [name] returns today for follow-up.  Overall she says she is doing very well  with Repatha.  She has had some fatigue which she gets for about 1 to 2 days after injection.  This has been pretty consistent and has not really improved however she says is very tolerable and she was very excited about her degree of cholesterol lowering and would like to continue with treatment.  Total cholesterol now 163, triglycerides 91, HDL 95 and LDL 50 (down from 131).  Continues on ezetimibe as well.  03/04/2022  [name] is seen today in follow-up.  She reports her fatigue has improved significantly.  Her cholesterol is now much lower with total cholesterol 153, triglycerides 78, HDL 89 and LDL 49.  Her LDL particle number has come down to 485.  Small LDL particle number is less than 90 nmol/L.  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  03/06/2023  [name] is seen today in follow-up.  She continues to do well on Repatha.  She has had persistently low cholesterol with possibly a very small increase but is still very well treated.  Her LDL particle number is 432 with an LDL of 58, HDL 92, triglycerides 91 and small LDL particle is still less than 90.  Her LDL particle size is a little larger at 21.5 nm.  She reports no clinical cardiac symptoms.   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    Dysrhythmia    brady   GERD (gastroesophageal reflux disease)    Headache    mirgaines   Heart murmur    Hypertension    Pneumonia    Stroke (HCC)    Tremors of nervous system     Past Surgical History:  Procedure Laterality Date   ABDOMINAL HYSTERECTOMY     BLADDER SUSPENSION  2001   CATHETER REMOVAL     CHOLECYSTECTOMY     ESOPHAGEAL MANOMETRY N/A 09/06/2021   Procedure: ESOPHAGEAL MANOMETRY (EM);  Surgeon: Shellia Cleverly, DO;  Location: [facility];  Service: Gastroenterology;  Laterality: N/A;   HERNIA REPAIR     LUMBAR DISC SURGERY     right foot surgery     rods and pins   TONSILLECTOMY     WISDOM TOOTH EXTRACTION  1978   WRIST SURGERY Bilateral    XI ROBOTIC ASSISTED PARAESOPHAGEAL HERNIA REPAIR N/A 11/13/2021   Procedure: XI ROBOTIC ASSISTED PARAESOPHAGEAL HIATAL HERNIA REPAIR WITH FUNDOPLICATION, BILATERAL TAP BLOCK;  Surgeon: Christy Soda, MD;  Location: [facility];  Service: General;  Laterality: N/A;    FAMHx:  Family History  Problem Relation Age of Onset   Heart attack Mother    Heart disease Mother    Hypertension Mother    Diabetes Father    Heart disease Father    Diabetes Brother    Colon polyps Brother    Stomach cancer Neg Hx    Esophageal cancer Neg Hx     SOCHx:   reports that she has quit smoking. Her smoking use included cigarettes. She does not have any smokeless tobacco history on file. She reports current alcohol use. She reports that she does not use drugs.  ALLERGIES:  Allergies  Allergen Reactions   Statins     Severe muscle aches    Tape Rash    Paper tape okay    MEDS:  Current Meds  Medication Sig   albuterol (VENTOLIN HFA) 108 (90 Base) MCG/ACT inhaler Inhale 1-2 puffs into the lungs every 4 (four) hours as needed for wheezing or shortness of breath.   ALPRAZolam (XANAX) 0.25 MG tablet Take 0.25 mg by mouth 3 (three) times daily as needed for anxiety.   aspirin EC 81 MG tablet Take 1 tablet (81 mg total) by mouth daily. Swallow whole.   CALCIUM CITRATE-VITAMIN D PO Take 2 tablets by mouth at bedtime.   cetirizine (ZYRTEC) 10 MG tablet Take 10 mg by mouth daily as needed for allergies.   Cholecalciferol 25 MCG (1000 UT) tablet Take 1,000 Units by mouth daily.   escitalopram (LEXAPRO) 20 MG tablet Take 20 mg by mouth at bedtime.   Evolocumab (REPATHA SURECLICK) 140 MG/ML SOAJ Inject 140 mg into the skin every 14 (fourteen) days.   ezetimibe (ZETIA)  10 MG tablet Take 10 mg by mouth at bedtime.   gabapentin (NEURONTIN) 800 MG tablet Take 800 mg by mouth at bedtime.   latanoprost (XALATAN) 0.005 % ophthalmic solution Place 1 drop into both eyes at bedtime.   losartan (COZAAR) 100 MG tablet Take 100 mg by mouth daily.   Magnesium 250 MG TABS Take 250 mg by mouth daily.   methocarbamol (ROBAXIN) 500 MG tablet Take 500 mg by mouth every 8 (eight) hours as needed for muscle spasms.   montelukast (SINGULAIR) 10 MG tablet Take 10 mg by mouth at bedtime.   Multiple Vitamin (MULTI-VITAMIN) tablet  Take 1 tablet by mouth daily.   ondansetron (ZOFRAN) 4 MG tablet Take 1 tablet (4 mg total) by mouth every 6 (six) hours as needed for nausea or vomiting.   pantoprazole (PROTONIX) 40 MG tablet Take 1 tablet (40 mg total) by mouth 2 (two) times daily.   primidone (MYSOLINE) 50 MG tablet Take 100 mg by mouth at bedtime.   tiZANidine (ZANAFLEX) 2 MG tablet Take 2 mg by mouth every 8 (eight) hours as needed for muscle spasms.   vitamin B-12 (CYANOCOBALAMIN) 500 MCG tablet Take 500 mcg by mouth daily.   zonisamide (ZONEGRAN) 100 MG capsule Take 100 mg by mouth at bedtime.     ROS: Pertinent items noted in HPI and remainder of comprehensive ROS otherwise negative.  Labs/Other Tests and Data Reviewed:    Recent Labs: 12/10/2022: TSH 1.510   Recent Lipid Panel No results found for: "CHOL", "TRIG", "HDL", "CHOLHDL", "LDLCALC", "LDLDIRECT"  Wt Readings from Last 3 Encounters:  03/06/23 186 lb (84.4 kg)  02/10/23 188 lb (85.3 kg)  12/10/22 183 lb 6.4 oz (83.2 kg)     Exam:    Vital Signs:  BP 129/70   Pulse 61   Ht 5\' 2"  (1.575 m)   Wt 186 lb (84.4 kg)   SpO2 96%   BMI 34.02 kg/m    General appearance: alert and no distress Lungs: no audible wheezing Abdomen: obese Extremities: extremities normal, atraumatic, no cyanosis or edema Neurologic: Grossly normal  ASSESSMENT & PLAN:    Mixed dyslipidemia, goal LDL 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 Junk has had excellent control of her cholesterol.  Will plan to continue her current therapies and reauthorize Repatha for another year.  Repeat lipid NMR and follow-up with me in 1 year.  COVID-19 Education: The signs and symptoms of COVID-19 were discussed with the patient and how to seek care for testing (follow up with PCP or arrange E-visit).  The importance of social distancing was discussed today.  Patient Risk:   After full review of this patients clinical status, I feel that they are at least moderate risk at this time.  Time:   Today, I have spent 15 minutes with the patient with telehealth technology discussing dyslipidemia.     Medication Adjustments/Labs and Tests Ordered: Current medicines are reviewed at length with the patient today.  Concerns regarding medicines are outlined above.   Tests Ordered: Orders Placed This Encounter  Procedures   NMR, lipoprofile    Medication Changes: No orders of the defined types were placed in this encounter.   Disposition:  in 1 year(s)  Chrystie Nose, MD, [facility], FACP  [facility]  Medical Director of the Advanced Lipid Disorders &  Cardiovascular Risk Reduction Clinic Diplomate of the American Board of Clinical Lipidology Attending Cardiologist  Direct Dial: [phone]  Fax: [phone]  Website:  [email or web address]  Chrystie Nose, MD  03/06/2023 8:33 AM

## 2023-03-09 ENCOUNTER — Telehealth: Payer: Self-pay

## 2023-03-09 DIAGNOSIS — G4733 Obstructive sleep apnea (adult) (pediatric): Secondary | ICD-10-CM

## 2023-03-09 NOTE — Telephone Encounter (Signed)
Notified patient of sleep study results and recommendations. I will mail a new device to patient by 03/11/23. Patient will inform me when she receives device so patient education cane be given.

## 2023-03-10 ENCOUNTER — Telehealth: Payer: Self-pay

## 2023-03-10 NOTE — Telephone Encounter (Signed)
**Note De-Identified Christy Jenkins Obfuscation** I started a Itamar-HST PA through [facility] provider portal. Itamar-HST PA is pending. Outpatient Authorization [phone]

## 2023-03-10 NOTE — Telephone Encounter (Signed)
**Note De-Identified [name] Obfuscation** Ordering provider: Dr Servando Salina Associated diagnoses: Snoring-R06.83 and Fatigue-R53.83 WatchPAT PA obtained on 03/10/2023 by Gregory Dowe, Lorelle Formosa, LPN. Authorization: Per [employer] provider portal: Outpatient Authorization [id number] [id number] Approved: [id number] - SLP STDY UNATTENDED Valid Dates: 03/10/2023 - 06/08/2023 Patient notified of PIN (1234) on 03/10/2023 Christy Jenkins Notification Method: phone. The pt is aware that we are currently out of WatchPAT One-HST and that as soon as we receive more we will be mailing her another device. She verbalized understanding and thanked me for my call.  Phone note routed to covering staff for follow-up.

## 2023-03-10 NOTE — Addendum Note (Signed)
Addended by: Brunetta Genera on: 03/10/2023 10:19 AM   Modules accepted: Orders

## 2023-03-12 ENCOUNTER — Other Ambulatory Visit (HOSPITAL_COMMUNITY): Payer: Self-pay

## 2023-03-12 ENCOUNTER — Other Ambulatory Visit (HOSPITAL_COMMUNITY): Payer: PPO

## 2023-03-12 ENCOUNTER — Other Ambulatory Visit (HOSPITAL_COMMUNITY)
Admission: RE | Admit: 2023-03-12 | Discharge: 2023-03-12 | Disposition: A | Discharge status: Home / Self Care | Payer: Self-pay | Source: Ambulatory Visit | Attending: Oncology | Admitting: Oncology

## 2023-03-12 DIAGNOSIS — Z006 Encounter for examination for normal comparison and control in clinical research program: Secondary | ICD-10-CM | POA: Insufficient documentation

## 2023-03-13 ENCOUNTER — Telehealth: Payer: Self-pay

## 2023-03-13 NOTE — Telephone Encounter (Signed)
Notified patient that Itamar device will be mailed today 03/13/23. Ordering provider: Tobb Associated diagnoses: OSA WatchPAT PA obtained on 03/13/2023 by Brunetta Genera, CMA. Authorization: Yes; tracking ID [id number]  Patient notified of PIN (1234) on 03/13/2023 via Notification Method: phone.

## 2023-03-19 ENCOUNTER — Other Ambulatory Visit (HOSPITAL_COMMUNITY): Payer: Self-pay

## 2023-03-21 LAB — GENECONNECT MOLECULAR SCREEN: Genetic Analysis Overall Interpretation: NEGATIVE

## 2023-03-31 ENCOUNTER — Other Ambulatory Visit (HOSPITAL_COMMUNITY): Payer: Self-pay

## 2023-04-03 ENCOUNTER — Telehealth: Payer: Self-pay | Admitting: Pharmacy Technician

## 2023-04-03 ENCOUNTER — Other Ambulatory Visit (HOSPITAL_COMMUNITY): Payer: Self-pay

## 2023-04-03 NOTE — Telephone Encounter (Signed)
-----   Message from Nurse Eileen Stanford E sent at 03/06/2023  8:25 AM EST ----- Regarding: repatha PA renewal GM team  This patient reported she needed her repatha PA renewed. Thanks!

## 2023-04-03 NOTE — Telephone Encounter (Signed)
 Pharmacy Patient Advocate Encounter   Received notification from Physician's Office that prior authorization for repatha  is required/requested.   Insurance verification completed.   The patient is insured through [facility] ADVANTAGE/RX ADVANCE .   Per test claim: PA required; PA submitted to above mentioned insurance via CoverMyMeds Key/confirmation #/[facility] Status is pending

## 2023-04-03 NOTE — Telephone Encounter (Signed)
 Pharmacy Patient Advocate Encounter  Received notification from [facility] [employer]ANT[employer]/RX ADVANCE that Prior Authorization for repatha has been APPROVED from 04/03/23 to 04/02/24   PA #/Case ID/Reference #: [id number]

## 2023-04-14 ENCOUNTER — Encounter (INDEPENDENT_AMBULATORY_CARE_PROVIDER_SITE_OTHER): Payer: PPO | Admitting: Cardiology

## 2023-04-14 DIAGNOSIS — G4719 Other hypersomnia: Secondary | ICD-10-CM

## 2023-04-14 DIAGNOSIS — R0683 Snoring: Secondary | ICD-10-CM | POA: Diagnosis not present

## 2023-04-14 NOTE — Telephone Encounter (Signed)
 Left message for patient to return the call.

## 2023-04-16 ENCOUNTER — Telehealth: Payer: Self-pay

## 2023-04-16 NOTE — Telephone Encounter (Signed)
-----   Message from Armanda Magic sent at 04/16/2023 10:07 AM EST ----- Please let patient know that sleep study showed no significant sleep apnea.

## 2023-04-16 NOTE — Procedures (Signed)
   SLEEP STUDY REPORT Patient Information Study Date: 04/14/2023 Patient Name: Christy Jenkins Patient ID: [id number] Birth Date: 1950-12-24 Age: 73 Gender: Female BMI: 32.4 (W=183 lb, H=5' 3'') Referring Physician: Kardie Tobb, DO  TEST DESCRIPTION: Home sleep apnea testing was completed using the WatchPat, a Type 1 device, utilizing  peripheral arterial tonometry (PAT), chest movement, actigraphy, pulse oximetry, pulse rate, body position and snore.  AHI was calculated with apnea and hypopnea using valid sleep time as the denominator. RDI includes apneas,  hypopneas, and RERAs. The data acquired and the scoring of sleep and all associated events were performed in  accordance with the recommended standards and specifications as outlined in the AASM Manual for the Scoring of  Sleep and Associated Events 2.2.0 (2015).   FINDINGS: 1. No evidence of Obstructive Sleep Apnea with AHI 1.4/hr.  2. No Central Sleep Apnea. 3. Oxygen desaturations as low as 84%. 4. Mild snoring was present. O2 sats were < 88% for 0.1 minutes. 5. Total sleep time was 6 hrs and 36 min. 6. 12.2% of total sleep time was spent in REM sleep.  7. Normal sleep onset latency at 25 min.  8. Shortened REM sleep onset latency at 75 min.  9. Total awakenings were 3.   DIAGNOSIS:  Normal study with no significant sleep disordered breathing.  RECOMMENDATIONS: 1. Normal study with no significant sleep disordered breathing.  2. Healthy sleep recommendations include: adequate nightly sleep (normal 7-9 hrs/night), avoidance of caffeine after  noon and alcohol near bedtime, and maintaining a sleep environment that is cool, dark and quiet.  3. Weight loss for overweight patients is recommended.   4. Snoring recommendations include: weight loss where appropriate, side sleeping, and avoidance of alcohol before  bed.  5. Operation of motor vehicle or dangerous equipment must be avoided when feeling drowsy, excessively sleepy,  or  mentally fatigued.   6. An ENT consultation which may be useful for specific causes of and possible treatment of bothersome snoring .   7. Weight loss may be of benefit in reducing the severity of snoring.   Signature: Wilbert Bihari, MD; [facility]; Diplomat, American Board of Sleep  Medicine Electronically Signed: 04/16/2023 10:05:32 AM

## 2023-04-16 NOTE — Telephone Encounter (Signed)
 Notified patient via VM, per DPR, of sleep study results and recommendations. Left call back number for any questions or concerns.

## 2023-05-26 ENCOUNTER — Ambulatory Visit: Payer: PPO | Admitting: Gastroenterology

## 2023-05-26 ENCOUNTER — Encounter: Payer: Self-pay | Admitting: Gastroenterology

## 2023-05-26 VITALS — BP 124/70 | HR 65 | Ht 62.0 in | Wt 185.0 lb

## 2023-05-26 DIAGNOSIS — K7689 Other specified diseases of liver: Secondary | ICD-10-CM | POA: Diagnosis not present

## 2023-05-26 DIAGNOSIS — K5900 Constipation, unspecified: Secondary | ICD-10-CM

## 2023-05-26 DIAGNOSIS — K219 Gastro-esophageal reflux disease without esophagitis: Secondary | ICD-10-CM

## 2023-05-26 DIAGNOSIS — R1310 Dysphagia, unspecified: Secondary | ICD-10-CM | POA: Diagnosis not present

## 2023-05-26 DIAGNOSIS — Z860100 Personal history of colon polyps, unspecified: Secondary | ICD-10-CM

## 2023-05-26 NOTE — Progress Notes (Signed)
 Chief Complaint:    Dysphagia, constipation, nausea  GI History: 73 y.o. female with a history of CAD, HTN, dyslipidemia, asthma, history of CVA, GERD, cholecystectomy, anxiety, depression, tremor, follows in the GI clinic for the following:  1) Hepatic cyst.  Incidentally noted on imaging for other reasons -05/04/2017: CT abdomen/pelvis: Liver appears slightly decreased in attenuation.  Low-attenuation lesions in the dome of the liver measuring up to 2.5 cm (likely cysts).  No duct dilatation - 01/28/2021: CT coronary: Moderate size hiatal hernia.  3.6 x 2.6 simple hepatic cyst - 06/03/2021: 3.8 x 3.2 x 2.6 cm lobulated cyst in left lobe of the liver (measuring 3.2 cm maximum diameter on CT in 05/2017) - 07/22/2021: Evaluated in the GI clinic.  Patient opted for observation alone  Normal liver enzymes in 03/2021. No personal hx of liver disease. No abdominal pain, jaundice, icteric sclera, ascites.  2) GERD 3) Dysphagia Longstanding history of reflux.  Index symptoms of heartburn, regurgitation.  More recently also with episodic solid food dysphagia.  No history of food impactions.  Symptoms worse with tomato-based sauces, oranges, ice tea.  Avoid eating within 2-3 hours of bedtime and sleeps with HOB elevated.  Reflux generally controlled with Protonix 40 mg bid, but still uses Tums on demand for breakthrough heartburn. - 01/21/2019: Evaluation by [facility].  Increased pantoprazole to 40 mg bid and continued Zantac 150 mg - 01/2019: EGD: Hiatal hernia, fundic gland polyps - 07/22/2021: Initial appointment in this GI clinic - 07/30/2021: Barium esophagram: Moderate size hiatal hernia, moderate to large volume gastroesophageal reflux to the level of the mid esophagus.  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 - 08/22/2021: EGD: 4 cm HH, LA Grade A esophagitis.  Esophagus dilated with 17 mm TTS balloon without mucosal rent.  Biopsies negative for EOE.  Multiple benign  fundic gland polyps. - 09/06/2021: Esophageal Manometry: Normal.  70% of swallows were peristaltic with normal latency and DCI.  30% of swallows with fragmented peristalsis.  No manometric HH.  Bolus clearance on 6/10 swallows. Rapid sequence swallows was normal - 10/22/2021: GES: Normal - 11/13/2021: Robotic assisted paraesophageal hernia repair, mesh reinforcement of incarcerated hiatal hernia, and partial toupee fundoplication - 02/17/2023: Barium esophagram: Intact fundoplication, normal motility, no hiatal hernia but there was spontaneous moderate to large volume reflux in the lower esophagus.     4) Constipation.  Symptoms started in 2024 with 6-7 days between BM.  No appreciable improvement with dietary modifications.  Started OTC laxatives and mag citrate.   Endoscopic History: - EGD (3/14): 5 cm hiatal hernia - Colonoscopy (05/2012): Normal colon with biopsies negative for microscopic colitis.  Grade 1 internal hemorrhoids, normal TI.  Recommended repeat in 5 years due to family history of colon polyps - EGD (01/2019; report viewed on patient's phone via her MyChart): Medium HH, mild gastritis, 6 pedunculated fundic gland polyps ranging 5-10 mm - Colonoscopy (01/2019;  report viewed on patient's phone via her MyChart): Transverse polyps 5-6 mm (HP), 4 mm rectal polyp (TA), IH. Repeat 5 years - EGD (07/2021): 4 cm HH, LA Grade A esophagitis.  Empiric dilation 17 mm TTS balloon.  Esophageal biopsies negative for EOE.  Fundic gland polyps.  HPI:     Patient is a 73 y.o. female presenting to the Gastroenterology Clinic for follow-up.  Last seen by me on 02/10/2023.  Main issue at that time was dysphagia and constipation.  No breakthrough reflux symptoms, but was having intermittent solid food dysphagia with meats and  breads.  Referred for barium esophagram with tablet with consideration for EGD depending on results.  For the constipation, started on MiraLAX purge prep then MiraLAX daily.  -  02/17/2023: Barium esophagram: Intact fundoplication, normal motility, no hiatal hernia but there was spontaneous moderate to large volume reflux in the lower esophagus.  Restarted Protonix 40 mg twice daily with consideration for repeat EGD  Today, she states her dysphagia has improved. Nausea improving.  Tolerating p.o. intake without issue.  Additionally, constipation has improved as well. Used Miralax, but stopped taking due to periodic loose stools and near-incontinence. Now using OTC Prunelax on-demand with overall improvement.    Reviewed most recent labs from 03/2023: Normal CBC, CMP, vitamin D.  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     ALPRAZolam (XANAX) 0.25 MG tablet Take 0.25 mg by mouth 3 (three) times daily as needed for anxiety.     aspirin EC 81 MG tablet Take 1 tablet (81 mg total) by mouth daily. Swallow whole. 90 tablet 3   CALCIUM CITRATE-VITAMIN D PO Take 2 tablets by mouth at bedtime.     cetirizine (ZYRTEC) 10 MG tablet Take 10 mg by mouth daily as needed for allergies.     Cholecalciferol 25 MCG (1000 UT) tablet Take 1,000 Units by mouth daily.     escitalopram (LEXAPRO) 20 MG tablet Take 20 mg by mouth at bedtime.      Evolocumab (REPATHA SURECLICK) 140 MG/ML SOAJ Inject 140 mg into the skin every 14 (fourteen) days. 2 mL 11   ezetimibe (ZETIA) 10 MG tablet Take 10 mg by mouth at bedtime.     gabapentin (NEURONTIN) 800 MG tablet Take 800 mg by mouth at bedtime.     latanoprost (XALATAN) 0.005 % ophthalmic solution Place 1 drop into both eyes at bedtime.     losartan (COZAAR) 100 MG tablet Take 100 mg by mouth daily.     Magnesium 250 MG TABS Take 250 mg by mouth daily.     methocarbamol (ROBAXIN) 500 MG tablet Take 500 mg by mouth every 8 (eight) hours as needed for muscle spasms.     montelukast (SINGULAIR) 10 MG tablet Take 10 mg by mouth at bedtime.     Multiple Vitamin (MULTI-VITAMIN) tablet Take 1 tablet by mouth daily.     ondansetron (ZOFRAN) 4 MG tablet Take 1 tablet (4 mg total) by mouth every 6 (six) hours as needed for nausea or vomiting. 60 tablet 3   pantoprazole (PROTONIX) 40 MG tablet Take 1 tablet (40 mg total) by mouth 2 (two) times daily. 60 tablet  3   primidone (MYSOLINE) 50 MG tablet Take 100 mg by mouth at bedtime.     tiZANidine (ZANAFLEX) 2 MG tablet Take 2 mg by mouth every 8 (eight) hours as needed for muscle spasms.     vitamin B-12 (CYANOCOBALAMIN) 500 MCG tablet Take 500 mcg by mouth daily.     zonisamide (ZONEGRAN) 100 MG capsule Take 100 mg by mouth at bedtime.     No current facility-administered medications for this visit.    Physical Exam:     BP 124/70   Pulse 65   Ht 5\' 2"  (1.575 m)   Wt 185 lb (83.9 kg)   BMI 33.84 kg/m   GENERAL:  Pleasant female in NAD PSYCH: : Cooperative, normal affect NEURO: Alert and oriented x 3, no focal neurologic deficits   IMPRESSION and PLAN:    1) GERD 2) Dysphagia Longstanding history of reflux, and eventually went for robotic assisted paraesophageal hernia repair, mesh reinforcement of incarcerated hiatal hernia, and partial toupee fundoplication in 10/2021.  Initially had resolution of reflux symptoms, but started having  breakthrough symptoms in 01/2023.  Barium esophagram showed intact fundoplication and no recurrence of hiatal hernia, but spontaneous moderate to large volume reflux into the lower 2/3rds of the esophagus.  Restarted Protonix high-dose with improvement. - Offered repeat EGD to eval for erosive esophagitis, wrap integrity. She would prefer to hold off for now, but if sxs persist/recur, will plan to schedule later this year at time of repeat colonoscopy - Suspect some degree of reflux burnout based on prior history and recent dysphagia with esophageal refluxate noted on esophagram.  Symptoms improving on high-dose PPI. - Continue Protonix as prescribed - To contact me if symptoms worsening  3) History of colon polyps - Plan for repeat colonoscopy in 01/2024 for ongoing polyp surveillance. - As above, if need for EGD this year, will plan to do in conjunction with colonoscopy  4) Hepatic cyst Simple hepatic cyst with stable size on CT in 2019, CT 12/2020, and  RUQ [place] in 05/2021.  Previously discussed role/utility of further imaging to include MRI Liver protocol in 6-12 months.  Based on overall size stability and benign appearance, she opted for observation alone.  5) Constipation Improving on current therapy - Continue current therapy - Continue adequate hydration is currently doing     I spent 32 minutes of time, including in depth chart review, independent review of results as outlined above, communicating results with the patient directly, face-to-face time with the patient, coordinating care, and ordering studies and medications as appropriate, and documentation.    Shellia Cleverly ,DO, FACG 05/26/2023, 1:31 PM

## 2023-05-26 NOTE — Patient Instructions (Addendum)
 _______________________________________________________  If your blood pressure at your visit was 140/90 or greater, please contact your primary care physician to follow up on this. _______________________________________________________  If you are age 72 or older, your body mass index should be between 23-30. Your Body mass index is 33.84 kg/m. If this is out of the aforementioned range listed, please consider follow up with your Primary Care Provider. ________________________________________________________  The [place] [facility] providers would like to encourage you to use [facility] to communicate with providers for non-urgent requests or questions.  Due to long hold times on the telephone, sending your provider a message by [facility] may be a faster and more efficient way to get a response.  Please allow 48 business hours for a response.  Please remember that this is for non-urgent requests.  _______________________________________________________  Christy Jenkins will need a colonoscopy in November 2025.  We will contact you to schedule this appointment.  You will need a follow up appointment in 1 year.  We will contact you to schedule this appointment. It was a pleasure to see you today!  Vito Cirigliano, D.O.

## 2023-06-01 DIAGNOSIS — M62838 Other muscle spasm: Secondary | ICD-10-CM | POA: Insufficient documentation

## 2023-06-15 ENCOUNTER — Encounter: Payer: Self-pay | Admitting: Cardiology

## 2023-06-15 ENCOUNTER — Other Ambulatory Visit: Payer: Self-pay | Admitting: Internal Medicine

## 2023-06-15 ENCOUNTER — Ambulatory Visit: Payer: PPO | Attending: Cardiology | Admitting: Cardiology

## 2023-06-15 VITALS — BP 120/74 | HR 74 | Ht 63.0 in | Wt 183.8 lb

## 2023-06-15 DIAGNOSIS — I2510 Atherosclerotic heart disease of native coronary artery without angina pectoris: Secondary | ICD-10-CM

## 2023-06-15 DIAGNOSIS — R5383 Other fatigue: Secondary | ICD-10-CM

## 2023-06-15 DIAGNOSIS — R0602 Shortness of breath: Secondary | ICD-10-CM

## 2023-06-15 DIAGNOSIS — I10 Essential (primary) hypertension: Secondary | ICD-10-CM

## 2023-06-15 DIAGNOSIS — E7800 Pure hypercholesterolemia, unspecified: Secondary | ICD-10-CM

## 2023-06-15 LAB — CBC WITH DIFFERENTIAL/PLATELET

## 2023-06-15 NOTE — Progress Notes (Signed)
 Cardiology Office Note:    Date:  06/15/2023   ID:  AZLEE MONFORTE, DOB 1951-02-14, MRN [id number]  PCP:  Bernadette Hoit, MD  Cardiologist:  Thomasene Ripple, DO  Electrophysiologist:  None   Referring MD: Bernadette Hoit, MD   " I am doing well"  History of Present Illness:    Christy Jenkins is a 73 y.o. female with a hx of Essential hypertension, mild CAD, fatigue here today for follow-up visit.  Saw the patient in September 24 at that time she reported that she had been experiencing some fatigue and Center for sleep study it was normal.  She tells me she still does have some fatigue at times shortness of breath.  No other complaints today.   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    Dysrhythmia    brady   GERD (gastroesophageal reflux disease)    Headache    mirgaines   Heart murmur    Hypertension    Pneumonia    Stroke (HCC)    Tremors of nervous system     Past Surgical History:  Procedure Laterality Date   ABDOMINAL HYSTERECTOMY     BLADDER SUSPENSION  2001   CATHETER REMOVAL     CHOLECYSTECTOMY     ESOPHAGEAL MANOMETRY N/A 09/06/2021   Procedure: ESOPHAGEAL MANOMETRY (EM);  Surgeon: Shellia Cleverly, DO;  Location: [facility];  Service: Gastroenterology;  Laterality: N/A;   HERNIA REPAIR     LUMBAR DISC SURGERY     right foot surgery     rods and pins   TONSILLECTOMY     WISDOM TOOTH EXTRACTION  1978   WRIST SURGERY Bilateral    XI ROBOTIC ASSISTED PARAESOPHAGEAL HERNIA REPAIR N/A 11/13/2021   Procedure: XI ROBOTIC ASSISTED PARAESOPHAGEAL HIATAL HERNIA REPAIR WITH FUNDOPLICATION, BILATERAL TAP BLOCK;  Surgeon: Karie Soda, MD;  Location: [facility];  Service: General;  Laterality: N/A;    Current Medications: Current Meds  Medication Sig   albuterol (VENTOLIN HFA) 108 (90 Base) MCG/ACT inhaler Inhale 1-2 puffs  into the lungs every 4 (four) hours as needed for wheezing or shortness of breath.   ALPRAZolam (XANAX) 0.25 MG tablet Take 0.25 mg by mouth 3 (three) times daily as needed for anxiety.   aspirin EC 81 MG tablet Take 1 tablet (81 mg total) by mouth daily. Swallow whole.   CALCIUM CITRATE-VITAMIN D PO Take 2 tablets by mouth at bedtime.   cetirizine (ZYRTEC) 10 MG tablet Take 10 mg by mouth daily as needed for allergies.   Cholecalciferol 25 MCG (1000 UT) tablet Take 1,000 Units by mouth daily.   escitalopram (LEXAPRO) 20 MG tablet Take 20 mg by mouth at bedtime.   Evolocumab (REPATHA SURECLICK) 140 MG/ML SOAJ Inject 140 mg into the skin every 14 (fourteen) days.   ezetimibe (ZETIA) 10 MG tablet Take 10 mg by mouth at bedtime.   gabapentin (NEURONTIN) 800 MG tablet Take 800 mg by mouth at bedtime.   latanoprost (XALATAN) 0.005 % ophthalmic solution Place 1 drop into both eyes at bedtime.   losartan (COZAAR) 100 MG tablet Take 100 mg by mouth daily.   Magnesium 250 MG TABS Take 250 mg by mouth daily.   methocarbamol (ROBAXIN) 500 MG tablet Take 500 mg by mouth every 8 (eight) hours as needed for  muscle spasms.   montelukast (SINGULAIR) 10 MG tablet Take 10 mg by mouth at bedtime.   Multiple Vitamin (MULTI-VITAMIN) tablet Take 1 tablet by mouth daily.   ondansetron (ZOFRAN) 4 MG tablet Take 1 tablet (4 mg total) by mouth every 6 (six) hours as needed for nausea or vomiting.   pantoprazole (PROTONIX) 40 MG tablet Take 1 tablet (40 mg total) by mouth 2 (two) times daily.   primidone (MYSOLINE) 50 MG tablet Take 100 mg by mouth at bedtime.   tiZANidine (ZANAFLEX) 2 MG tablet Take 2 mg by mouth every 8 (eight) hours as needed for muscle spasms.   vitamin B-12 (CYANOCOBALAMIN) 500 MCG tablet Take 500 mcg by mouth daily.   zonisamide (ZONEGRAN) 100 MG capsule Take 100 mg by mouth at bedtime.     Allergies:   Statins and Tape   Social History   Socioeconomic History   Marital status: Single     Spouse name: Not on file   Number of children: Not on file   Years of education: Not on file   Highest education level: Not on file  Occupational History   Not on file  Tobacco Use   Smoking status: Former    Types: Cigarettes   Smokeless tobacco: Not on file  Vaping Use   Vaping status: Never Used  Substance and Sexual Activity   Alcohol use: Yes    Comment: rare   Drug use: Never   Sexual activity: Not on file  Other Topics Concern   Not on file  Social History Narrative   Not on file   Social Drivers of Health   Financial Resource Strain: Low Risk  (08/25/2022)   Received from [facility]   Overall Financial Resource Strain (CARDIA)    Difficulty of Paying Living Expenses: Not hard at all  Food Insecurity: Low Risk  (03/12/2023)   Received from [facility]   Hunger Vital Sign    Worried About Running Out of Food in the Last Year: Never true    Ran Out of Food in the Last Year: Never true  Transportation Needs: No Transportation Needs (03/12/2023)   Received from [employer]    In the past 12 months, has lack of reliable transportation kept you from medical appointments, meetings, work or from getting things needed for daily living? : No  Physical Activity: Insufficiently Active (08/25/2022)   Received from [facility]   Exercise Vital Sign    Days of Exercise per Week: 2 days    Minutes of Exercise per Session: 30 min  Stress: No Stress Concern Present (08/25/2022)   Received from [facility] - Occupational Stress Questionnaire    Feeling of Stress : Only a little  Social Connections: Moderately Integrated (08/25/2022)   Received from [facility]   Social Network    How would you rate your social network (family, work, friends)?: Adequate participation with social networks     Family History: The patient's family history includes Colon polyps in her brother; Diabetes in her brother and father;  Heart attack in her mother; Heart disease in her father and mother; Hypertension in her mother. There is no history of Stomach cancer or Esophageal cancer.  ROS:   Review of Systems  Constitution: Negative for decreased appetite, fever and weight gain.  HENT: Negative for congestion, ear discharge, hoarse voice and sore throat.   Eyes: Negative for discharge, redness, vision loss in right eye  and visual halos.  Cardiovascular: Negative for chest pain, dyspnea on exertion, leg swelling, orthopnea and palpitations.  Respiratory:  Reports shortness of breath. Negative for cough, hemoptysis, and snoring.   Endocrine: Negative for heat intolerance and polyphagia.  Hematologic/Lymphatic: Negative for bleeding problem. Does not bruise/bleed easily.  Skin: Negative for flushing, nail changes, rash and suspicious lesions.  Musculoskeletal: Negative for arthritis, joint pain, muscle cramps, myalgias, neck pain and stiffness.  Gastrointestinal: Negative for abdominal pain, bowel incontinence, diarrhea and excessive appetite.  Genitourinary: Negative for decreased libido, genital sores and incomplete emptying.  Neurological: Negative for brief paralysis, focal weakness, headaches and loss of balance.  Psychiatric/Behavioral: Negative for altered mental status, depression and suicidal ideas.  Allergic/Immunologic: Negative for HIV exposure and persistent infections.    EKGs/Labs/Other Studies Reviewed:    The following studies were reviewed today:   EKG: None today  Recent Labs: 12/10/2022: TSH 1.510  Recent Lipid Panel No results found for: "CHOL", "TRIG", "HDL", "CHOLHDL", "VLDL", "LDLCALC", "LDLDIRECT"  Physical Exam:    VS:  BP 120/74 (BP Location: Right Arm, Patient Position: Sitting, Cuff Size: Normal)   Pulse 74   Ht 5\' 3"  (1.6 m)   Wt 183 lb 12.8 oz (83.4 kg)   SpO2 94%   BMI 32.56 kg/m     Wt Readings from Last 3 Encounters:  06/15/23 183 lb 12.8 oz (83.4 kg)  05/26/23 185 lb  (83.9 kg)  03/06/23 186 lb (84.4 kg)     GEN: Well nourished, well developed in no acute distress HEENT: Normal NECK: No JVD; No carotid bruits LYMPHATICS: No lymphadenopathy CARDIAC: S1S2 noted,RRR, no murmurs, rubs, gallops RESPIRATORY:  Clear to auscultation without rales, wheezing or rhonchi  ABDOMEN: Soft, non-tender, non-distended, +bowel sounds, no guarding. EXTREMITIES: No edema, No cyanosis, no clubbing MUSCULOSKELETAL:  No deformity  SKIN: Warm and dry NEUROLOGIC:  Alert and oriented x 3, non-focal PSYCHIATRIC:  Normal affect, good insight  ASSESSMENT:    1. Fatigue, unspecified type   2. SOB (shortness of breath)   3. Mild CAD   4. Hypercholesterolemia   5. Essential (primary) hypertension    PLAN:    Still fatigue with orders of breath-will get CBC and iron panel to make sure that were not missing anything on this patient.  In terms of her coronary artery disease no symptoms.  Continue aspirin and lipid-lowering agents with Repatha and Zetia.  Blood pressure is acceptable, continue with current antihypertensive regimen.   The patient is in agreement with the above plan. The patient left the office in stable condition.  The patient will follow up in   Medication Adjustments/Labs and Tests Ordered: Current medicines are reviewed at length with the patient today.  Concerns regarding medicines are outlined above.  Orders Placed This Encounter  Procedures   CBC with Differential/Platelet   Iron, TIBC and Ferritin Panel   No orders of the defined types were placed in this encounter.   Patient Instructions  Medication Instructions:  Your physician recommends that you continue on your current medications as directed. Please refer to the Current Medication list given to you today.  *If you need a refill on your cardiac medications before your next appointment, please call your pharmacy*   Lab Work: CBC, Iron panel If you have labs (blood work) drawn today and  your tests are completely normal, you will receive your results only by: MyChart Message (if you have MyChart) OR A paper copy in the mail If you have any lab test  that is abnormal or we need to change your treatment, we will call you to review the results.   Follow-Up: At [facility], you and your health needs are our priority.  As part of our continuing mission to provide you with exceptional heart care, we have created designated Provider Care Teams.  These Care Teams include your primary Cardiologist (physician) and Advanced Practice Providers (APPs -  Physician Assistants and Nurse Practitioners) who all work together to provide you with the care you need, when you need it.   Your next appointment:   1 year(s)  Provider:   Thomasene Ripple, DO     Adopting a Healthy Lifestyle.  Know what a healthy weight is for you (roughly BMI <25) and aim to maintain this   Aim for 7+ servings of fruits and vegetables daily   65-80+ fluid ounces of water or unsweet tea for healthy kidneys   Limit to max 1 drink of alcohol per day; avoid smoking/tobacco   Limit animal fats in diet for cholesterol and heart health - choose grass fed whenever available   Avoid highly processed foods, and foods high in saturated/trans fats   Aim for low stress - take time to unwind and care for your mental health   Aim for 150 min of moderate intensity exercise weekly for heart health, and weights twice weekly for bone health   Aim for 7-9 hours of sleep daily   When it comes to diets, agreement about the perfect plan isnt easy to find, even among the experts. Experts at the [facility] [employer] developed an idea known as the Healthy Eating Plate. Just imagine a plate divided into logical, healthy portions.   The emphasis is on diet quality:   Load up on vegetables and fruits - one-half of your plate: Aim for color and variety, and remember that potatoes dont count.   Go for whole  grains - one-quarter of your plate: Whole wheat, barley, wheat berries, quinoa, oats, brown rice, and foods made with them. If you want pasta, go with whole wheat pasta.   Protein power - one-quarter of your plate: Fish, chicken, beans, and nuts are all healthy, versatile protein sources. Limit red meat.   The diet, however, does go beyond the plate, offering a few other suggestions.   Use healthy plant oils, such as olive, canola, soy, corn, sunflower and peanut. Check the labels, and avoid partially hydrogenated oil, which have unhealthy trans fats.   If youre thirsty, drink water. Coffee and tea are good in moderation, but skip sugary drinks and limit milk and dairy products to one or two daily servings.   The type of carbohydrate in the diet is more important than the amount. Some sources of carbohydrates, such as vegetables, fruits, whole grains, and beans-are healthier than others.   Finally, stay active  Signed, Thomasene Ripple, DO  06/15/2023 8:49 AM    [facility]

## 2023-06-15 NOTE — Patient Instructions (Signed)
 Medication Instructions:  Your physician recommends that you continue on your current medications as directed. Please refer to the Current Medication list given to you today.  *If you need a refill on your cardiac medications before your next appointment, please call your pharmacy*   Lab Work: CBC, Iron panel If you have labs (blood work) drawn today and your tests are completely normal, you will receive your results only by: MyChart Message (if you have MyChart) OR A paper copy in the mail If you have any lab test that is abnormal or we need to change your treatment, we will call you to review the results.   Follow-Up: At [facility], you and your health needs are our priority.  As part of our continuing mission to provide you with exceptional heart care, we have created designated Provider Care Teams.  These Care Teams include your primary Cardiologist (physician) and Advanced Practice Providers (APPs -  Physician Assistants and Nurse Practitioners) who all work together to provide you with the care you need, when you need it.   Your next appointment:   1 year(s)  Provider:   Thomasene Ripple, DO

## 2023-06-16 ENCOUNTER — Encounter: Payer: Self-pay | Admitting: Cardiology

## 2023-06-16 LAB — CBC WITH DIFFERENTIAL/PLATELET
Basos: 1 %
EOS (ABSOLUTE): 0.1 10*3/uL (ref 0.0–0.2)
Eos: 5 %
Hematocrit: 37.3 % (ref 34.0–46.6)
Hemoglobin: 12.2 g/dL (ref 11.1–15.9)
Immature Granulocytes: 0 %
Immature Granulocytes: 0 10*3/uL (ref 0.0–0.1)
Lymphs: 30 %
MCH: 29.5 pg (ref 26.6–33.0)
MCHC: 32.7 g/dL (ref 31.5–35.7)
MCV: 90 fL (ref 79–97)
Monocytes Absolute: 0.3 10*3/uL (ref 0.0–0.4)
Monocytes Absolute: 0.4 10*3/uL (ref 0.1–0.9)
Monocytes: 8 %
Neutrophils Absolute: 1.6 10*3/uL (ref 0.7–3.1)
Neutrophils Absolute: 3 10*3/uL (ref 1.4–7.0)
Neutrophils: 56 %
Platelets: 232 10*3/uL (ref 150–450)
RBC: 4.14 x10E6/uL (ref 3.77–5.28)
RDW: 12.8 % (ref 11.7–15.4)
WBC: 5.3 10*3/uL (ref 3.4–10.8)

## 2023-06-16 LAB — IRON,TIBC AND FERRITIN PANEL
Ferritin: 50 ng/mL (ref 15–150)
Iron Saturation: 20 % (ref 15–55)
Iron: 63 ug/dL (ref 27–139)
Total Iron Binding Capacity: 311 ug/dL (ref 250–450)
UIBC: 248 ug/dL (ref 118–369)

## 2023-07-15 ENCOUNTER — Ambulatory Visit: Admitting: Family Medicine

## 2023-07-15 ENCOUNTER — Encounter: Payer: Self-pay | Admitting: Family Medicine

## 2023-07-15 VITALS — BP 122/82 | HR 65 | Temp 98.6°F | Ht 63.0 in | Wt 182.4 lb

## 2023-07-15 DIAGNOSIS — I10 Essential (primary) hypertension: Secondary | ICD-10-CM

## 2023-07-15 DIAGNOSIS — E782 Mixed hyperlipidemia: Secondary | ICD-10-CM

## 2023-07-15 DIAGNOSIS — J302 Other seasonal allergic rhinitis: Secondary | ICD-10-CM

## 2023-07-15 DIAGNOSIS — F419 Anxiety disorder, unspecified: Secondary | ICD-10-CM

## 2023-07-15 DIAGNOSIS — E7800 Pure hypercholesterolemia, unspecified: Secondary | ICD-10-CM

## 2023-07-15 DIAGNOSIS — R7309 Other abnormal glucose: Secondary | ICD-10-CM

## 2023-07-15 DIAGNOSIS — M47812 Spondylosis without myelopathy or radiculopathy, cervical region: Secondary | ICD-10-CM

## 2023-07-15 DIAGNOSIS — G43909 Migraine, unspecified, not intractable, without status migrainosus: Secondary | ICD-10-CM | POA: Insufficient documentation

## 2023-07-15 MED ORDER — ALPRAZOLAM 0.25 MG PO TABS: 0.2500 mg | ORAL_TABLET | Freq: Three times a day (TID) | ORAL | 0 refills | Status: DC | PRN

## 2023-07-15 MED ORDER — EZETIMIBE 10 MG PO TABS: 10.0000 mg | ORAL_TABLET | Freq: Every day | ORAL | 1 refills | Status: DC

## 2023-07-15 MED ORDER — MONTELUKAST SODIUM 10 MG PO TABS: 10.0000 mg | ORAL_TABLET | Freq: Every day | ORAL | 3 refills | Status: DC

## 2023-07-15 MED ORDER — LOSARTAN POTASSIUM 100 MG PO TABS: 100.0000 mg | ORAL_TABLET | Freq: Every day | ORAL | 1 refills | Status: DC

## 2023-07-15 MED ORDER — ESCITALOPRAM OXALATE 20 MG PO TABS: 20.0000 mg | ORAL_TABLET | Freq: Every day | ORAL | 1 refills | Status: DC

## 2023-07-15 NOTE — Progress Notes (Signed)
 Patient Office Visit  Assessment & Plan:  Essential (primary) hypertension -     Losartan Potassium; Take 1 tablet (100 mg total) by mouth daily.  Dispense: 90 tablet; Refill: 1 -     COMPLETE METABOLIC PANEL WITHOUT GFR -     TSH  Anxiety -     Escitalopram Oxalate; Take 1 tablet (20 mg total) by mouth at bedtime.  Dispense: 90 tablet; Refill: 1 -     ALPRAZolam; Take 1 tablet (0.25 mg total) by mouth 3 (three) times daily as needed for anxiety.  Dispense: 30 tablet; Refill: 0  Hypercholesterolemia -     Ezetimibe; Take 1 tablet (10 mg total) by mouth at bedtime.  Dispense: 90 tablet; Refill: 1  Mixed hyperlipidemia -     Ezetimibe; Take 1 tablet (10 mg total) by mouth at bedtime.  Dispense: 90 tablet; Refill: 1  Seasonal allergies -     Montelukast Sodium; Take 1 tablet (10 mg total) by mouth at bedtime.  Dispense: 90 tablet; Refill: 3  Elevated glucose -     Hemoglobin A1c  Cervical arthritis -     VITAMIN D 25 Hydroxy (Vit-D Deficiency, Fractures)   Test results were reviewed and analyzed as part of the medical decision making of this visit.  Recommend healthy diet i.e mediterranean/DASH diet, consistent exercise - 30 minutes 5 day per week, and gradual weight loss. Return in about 4 months (around 11/14/2023), or if symptoms worsen or fail to improve.   Subjective:    Patient ID: Christy Jenkins, female    DOB: 1950/06/23  Age: 73 y.o. MRN: [id number]  Chief Complaint  Patient presents with   Medical Management of Chronic Issues   Establish Care    HPI Establish medical care and discuss chronic medical issues Hyperlipidemia- not taking statin therapy.  Could not tolerate statin medications. Aware of need for diet control, exercise and healthy eating.  Pt is on Repatha and Zetia. Patient does see Dr. Hulen Mages cardiology Elevated glucose- Had A1C 5.7 last year. Denies diarrhea, peripheral swelling, hypoglycemia, excessive thirst, excessive urination, visual  fluctuation, and fatigue.  Making an effort on diet control and exercise.  Patient does eat healthy for the most part and stays active.  HTN-using antihypertensive medication without difficulty.  Denies associated signs and symptoms including chest pain, shortness of breath, cough headache, peripheral swelling cramps spasms and palpitations.  Voices understanding of the potential for interference with blood pressure control with substances including high sodium intake, decongestions, herbal supplements weight loss supplements nutritional supplements.  Blood pressures at home are less than 140/90.   Allergic Rhinitis:  having worsening allergic rhinitis. Patient's symptoms include cough, headaches, nasal congestion, postnasal drip, sinus pressure and sneezing. These symptoms are seasonal. Current triggers include exposure to pollens. The patient has been suffering from these symptoms for approximately several months. The patient has tried over the counter medications with good relief of symptoms.  Chronic neck pain-Pt has seen Dr. Blain Bulls, Dr. Eladio Graven at [facility].  IMPRESSION:  1. 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 Mild degenerative marrow edema at the left C2-3  facet.  2. Bilateral foraminal impingement at C3-4 to the C5-6 and on the  left at C6-7.  3. Up to mild spinal stenosis at C3-4 to C5-6.  Essential Tremors- pt taking Primidone for tremors per Neurology.  Per Dr. Tonuzi he is considering Botox injections, OT, movement disorder evaluation for possible DBS GERD--history of gastroesophageal reflux disease (  GERD), managed with Protonix 40mg  PO BID. She reports no chronic sore throat, voice changes, hemoptysis, or hematochezia. He has a history of dysphagia and followed by gastroenterologist. Pt had hernia surgery but still having symptoms. Pt not sure what the next steps will be.  Anxiety/Depression- pt taking Lexapro 20mg  once per day and  Xanax as needed. Patient does not take xanax daily.  Insomnia- pt taking Gabapentin 800mg  at night time which helps her sleep and also helps with tingling right arm and neck pain.   The ASCVD Risk score (Arnett DK, et al., 2019) failed to calculate for the following reasons:   Risk score cannot be calculated because patient has a medical history suggesting prior/existing ASCVD  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    Dysrhythmia    brady   GERD (gastroesophageal reflux disease)    Headache    mirgaines   Heart murmur    Hypertension    Pneumonia    Stroke (HCC)    Tremors of nervous system    Past Surgical History:  Procedure Laterality Date   ABDOMINAL HYSTERECTOMY     BLADDER SUSPENSION  2001   CATHETER REMOVAL     CHOLECYSTECTOMY     ESOPHAGEAL MANOMETRY N/A 09/06/2021   Procedure: ESOPHAGEAL MANOMETRY (EM);  Surgeon: Shellia Cleverly, DO;  Location: [facility];  Service: Gastroenterology;  Laterality: N/A;   HERNIA REPAIR     LUMBAR DISC SURGERY     right foot surgery     rods and pins   TONSILLECTOMY     WISDOM TOOTH EXTRACTION  1978   WRIST SURGERY Bilateral    XI ROBOTIC ASSISTED PARAESOPHAGEAL HERNIA REPAIR N/A 11/13/2021   Procedure: XI ROBOTIC ASSISTED PARAESOPHAGEAL HIATAL HERNIA REPAIR WITH FUNDOPLICATION, BILATERAL TAP BLOCK;  Surgeon: Karie Soda, MD;  Location: [facility];  Service: General;  Laterality: N/A;   Social History   Tobacco Use   Smoking status: Former    Types: Cigarettes  Vaping Use   Vaping status: Never Used  Substance Use Topics   Alcohol use: Yes    Comment: rare   Drug use: Never   Family History  Problem Relation Age of Onset   Heart attack Mother    Heart disease Mother    Hypertension Mother    Diabetes Father    Heart disease Father    Diabetes Brother    Colon polyps Brother     Stomach cancer Neg Hx    Esophageal cancer Neg Hx    Allergies  Allergen Reactions   Dust Mite Extract Other (See Comments) and Cough   Molds & Smuts Cough, Itching, Other (See Comments) and Shortness Of Breath   Pollen Extract Cough, Itching, Rash, Other (See Comments) and Shortness Of Breath    Other Reaction(s): Bronchospasm   Statins     Severe muscle aches    Tree Extract Cough, Other (See Comments), Rash and Shortness Of Breath   Tape Rash and Itching    Paper tape okay  Other Reaction(s): Dermatitis    Paper tape okay    ROS    Objective:    BP 122/82   Pulse 65   Temp 98.6 F (37 C)   Ht 5\' 3"  (1.6 m)   Wt 182 lb 6 oz (82.7 kg)  SpO2 99%   BMI 32.31 kg/m  BP Readings from Last 3 Encounters:  07/15/23 122/82  06/15/23 120/74  05/26/23 124/70   Wt Readings from Last 3 Encounters:  07/15/23 182 lb 6 oz (82.7 kg)  06/15/23 183 lb 12.8 oz (83.4 kg)  05/26/23 185 lb (83.9 kg)    Physical Exam Vitals and nursing note reviewed.  Constitutional:      Appearance: Normal appearance.  HENT:     Head: Normocephalic.     Right Ear: Tympanic membrane, ear canal and external ear normal.     Left Ear: Tympanic membrane, ear canal and external ear normal.  Eyes:     Extraocular Movements: Extraocular movements intact.     Conjunctiva/sclera: Conjunctivae normal.     Pupils: Pupils are equal, round, and reactive to light.  Cardiovascular:     Rate and Rhythm: Normal rate and regular rhythm.     Heart sounds: Normal heart sounds.  Pulmonary:     Effort: Pulmonary effort is normal.     Breath sounds: Normal breath sounds.  Musculoskeletal:     Right lower leg: No edema.     Left lower leg: No edema.  Neurological:     General: No focal deficit present.     Mental Status: She is alert and oriented to person, place, and time.  Psychiatric:        Mood and Affect: Mood normal.        Behavior: Behavior normal.        Thought Content: Thought content normal.         Judgment: Judgment normal.      No results found for any visits on 07/15/23.

## 2023-07-16 LAB — COMPLETE METABOLIC PANEL WITHOUT GFR
AG Ratio: 1.8 (calc) (ref 1.0–2.5)
ALT: 23 U/L (ref 6–29)
AST: 18 U/L (ref 10–35)
Albumin: 4 g/dL (ref 3.6–5.1)
Alkaline phosphatase (APISO): 95 U/L (ref 37–153)
BUN: 17 mg/dL (ref 7–25)
CO2: 27 mmol/L (ref 20–32)
Calcium: 9.2 mg/dL (ref 8.6–10.4)
Chloride: 103 mmol/L (ref 98–110)
Creat: 0.7 mg/dL (ref 0.60–1.00)
Globulin: 2.2 g/dL (ref 1.9–3.7)
Glucose, Bld: 84 mg/dL (ref 65–99)
Potassium: 4.1 mmol/L (ref 3.5–5.3)
Sodium: 141 mmol/L (ref 135–146)
Total Bilirubin: 0.3 mg/dL (ref 0.2–1.2)
Total Protein: 6.2 g/dL (ref 6.1–8.1)

## 2023-07-16 LAB — VITAMIN D 25 HYDROXY (VIT D DEFICIENCY, FRACTURES): Vit D, 25-Hydroxy: 47 ng/mL (ref 30–100)

## 2023-07-16 LAB — HEMOGLOBIN A1C
Hgb A1c MFr Bld: 5.4 % (ref <5.7)
Mean Plasma Glucose: 108 mg/dL
eAG (mmol/L): 6 mmol/L

## 2023-07-16 LAB — TSH: TSH: 1.89 m[IU]/L (ref 0.40–4.50)

## 2023-07-20 ENCOUNTER — Encounter: Payer: Self-pay | Admitting: Family Medicine

## 2023-08-11 ENCOUNTER — Other Ambulatory Visit: Payer: Self-pay | Admitting: Gastroenterology

## 2023-09-21 ENCOUNTER — Ambulatory Visit: Payer: PPO | Admitting: Family Medicine

## 2023-09-30 ENCOUNTER — Other Ambulatory Visit (HOSPITAL_COMMUNITY): Payer: Self-pay

## 2023-10-15 ENCOUNTER — Ambulatory Visit: Admitting: *Deleted

## 2023-10-15 VITALS — Ht 63.0 in | Wt 182.0 lb

## 2023-10-15 DIAGNOSIS — Z0000 Encounter for general adult medical examination without abnormal findings: Secondary | ICD-10-CM | POA: Diagnosis not present

## 2023-10-15 NOTE — Progress Notes (Signed)
 Subjective:   Christy Jenkins is a 73 y.o. female who presents for Medicare Annual (Subsequent) preventive examination.  Visit Complete: Virtual I connected with  Christy Jenkins on 10/15/23 by a video and audio enabled telemedicine application and verified that I am speaking with the correct person using two identifiers.  Patient Location: Home  Provider Location: Home Office  I discussed the limitations of evaluation and management by telemedicine. The patient expressed understanding and agreed to proceed.  Vital Signs: Because this visit was a virtual/telehealth visit, some criteria may be missing or patient reported. Any vitals not documented were not able to be obtained and vitals that have been documented are patient reported.  Cardiac Risk Factors include: advanced age (>65men, >31 women);obesity (BMI >30kg/m2);hypertension     Objective:    Today's Vitals   10/15/23 1232  Weight: 182 lb (82.6 kg)  Height: 5' 3 (1.6 m)   Body mass index is 32.24 kg/m.     10/15/2023   12:30 PM 11/13/2021    6:21 PM 11/13/2021   11:44 AM 11/07/2021    1:16 PM  Advanced Directives  Does Patient Have a Medical Advance Directive? Yes Yes Yes Yes  Type of Advance Directive Healthcare Power of Attorney Living will Living will;Healthcare Power of [employer] Power of [place];Living will  Does patient want to make changes to medical advance directive?  No - Patient declined    Copy of Healthcare Power of Attorney in Chart? No - copy requested No - copy requested  No - copy requested    Current Medications (verified) Outpatient Encounter Medications as of 10/15/2023  Medication Sig   albuterol  (VENTOLIN  HFA) 108 (90 Base) MCG/ACT inhaler Inhale 1-2 puffs into the lungs every 4 (four) hours as needed for wheezing or shortness of breath.   ALPRAZolam  (XANAX ) 0.25 MG tablet Take 1 tablet (0.25 mg total) by mouth 3 (three) times daily as needed for anxiety.   aspirin  EC 81 MG tablet  Take 1 tablet (81 mg total) by mouth daily. Swallow whole.   CALCIUM CITRATE-VITAMIN D  PO Take 2 tablets by mouth at bedtime.   cetirizine (ZYRTEC) 10 MG tablet Take 10 mg by mouth daily as needed for allergies.   Cholecalciferol 25 MCG (1000 UT) tablet Take 1,000 Units by mouth daily.   escitalopram  (LEXAPRO ) 20 MG tablet Take 1 tablet (20 mg total) by mouth at bedtime.   ezetimibe  (ZETIA ) 10 MG tablet Take 1 tablet (10 mg total) by mouth at bedtime.   gabapentin  (NEURONTIN ) 800 MG tablet Take 800 mg by mouth at bedtime.   latanoprost  (XALATAN ) 0.005 % ophthalmic solution Place 1 drop into both eyes at bedtime.   losartan  (COZAAR ) 100 MG tablet Take 1 tablet (100 mg total) by mouth daily.   Magnesium  250 MG TABS Take 250 mg by mouth daily.   methocarbamol  (ROBAXIN ) 500 MG tablet Take 500 mg by mouth every 8 (eight) hours as needed for muscle spasms.   montelukast  (SINGULAIR ) 10 MG tablet Take 1 tablet (10 mg total) by mouth at bedtime.   Multiple Vitamin (MULTI-VITAMIN) tablet Take 1 tablet by mouth daily.   ondansetron  (ZOFRAN ) 4 MG tablet Take 1 tablet (4 mg total) by mouth every 6 (six) hours as needed for nausea or vomiting.   pantoprazole  (PROTONIX ) 40 MG tablet TAKE 1 TABLET BY MOUTH TWICE DAILY   primidone  (MYSOLINE ) 50 MG tablet Take 100 mg by mouth at bedtime.   REPATHA  SURECLICK 140 MG/ML SOAJ INJECT 140  MG INTO THE SKIN EVERY 14 DAYS.   tiZANidine  (ZANAFLEX ) 2 MG tablet Take 2 mg by mouth every 8 (eight) hours as needed for muscle spasms.   vitamin B-12 (CYANOCOBALAMIN ) 500 MCG tablet Take 500 mcg by mouth daily.   zonisamide  (ZONEGRAN ) 100 MG capsule Take 100 mg by mouth at bedtime.   No facility-administered encounter medications on file as of 10/15/2023.    Allergies (verified) Dust mite extract, Molds & smuts, Pollen extract, Statins, Tree extract, and Tape   History: Past Medical History:  Diagnosis Date   Anxiety and depression    Arthritis    Asthma    Coronary  artery disease    mild   Disease of eye characterized by increased eye pressure    Dysrhythmia    brady   GERD (gastroesophageal reflux disease)    Headache    mirgaines   Heart murmur    Hypertension    Pneumonia    Stroke (HCC)    Tremors of nervous system    Past Surgical History:  Procedure Laterality Date   ABDOMINAL HYSTERECTOMY     BLADDER SUSPENSION  2001   CATHETER REMOVAL     CHOLECYSTECTOMY     ESOPHAGEAL MANOMETRY N/A 09/06/2021   Procedure: ESOPHAGEAL MANOMETRY (EM);  Surgeon: Christy Sandor GAILS, DO;  Location: [facility];  Service: Gastroenterology;  Laterality: N/A;   HERNIA REPAIR     LUMBAR DISC SURGERY     right foot surgery     rods and pins   TONSILLECTOMY     WISDOM TOOTH EXTRACTION  1978   WRIST SURGERY Bilateral    XI ROBOTIC ASSISTED PARAESOPHAGEAL HERNIA REPAIR N/A 11/13/2021   Procedure: XI ROBOTIC ASSISTED PARAESOPHAGEAL HIATAL HERNIA REPAIR WITH FUNDOPLICATION, BILATERAL TAP BLOCK;  Surgeon: Christy Standing, MD;  Location: [facility];  Service: General;  Laterality: N/A;   Family History  Problem Relation Age of Onset   Heart attack Mother    Heart disease Mother    Hypertension Mother    Diabetes Father    Heart disease Father    Diabetes Brother    Colon polyps Brother    Stomach cancer Neg Hx    Esophageal cancer Neg Hx    Social History   Socioeconomic History   Marital status: Single    Spouse name: Not on file   Number of children: Not on file   Years of education: Not on file   Highest education level: Associate degree: academic program  Occupational History   Not on file  Tobacco Use   Smoking status: Former    Types: Cigarettes   Smokeless tobacco: Not on file  Vaping Use   Vaping status: Never Used  Substance and Sexual Activity   Alcohol use: Yes    Comment: rare   Drug use: Never   Sexual activity: Not Currently  Other Topics Concern   Not on file  Social History Narrative   Patient working part time. Has 3  grandchildren (about to have 4th grandchild).    Social Drivers of Corporate investment banker Strain: Low Risk  (10/15/2023)   Overall Financial Resource Strain (CARDIA)    Difficulty of Paying Living Expenses: Not hard at all  Food Insecurity: No Food Insecurity (10/15/2023)   Hunger Vital Sign    Worried About Running Out of Food in the Last Year: Never true    Ran Out of Food in the Last Year: Never true  Transportation Needs: No Transportation Needs (10/15/2023)  PRAPARE - Administrator, Civil Service (Medical): No    Lack of Transportation (Non-Medical): No  Physical Activity: Inactive (10/15/2023)   Exercise Vital Sign    Days of Exercise per Week: 0 days    Minutes of Exercise per Session: 0 min  Stress: Stress Concern Present (10/15/2023)   [employer] of Occupational Health - Occupational Stress Questionnaire    Feeling of Stress: To some extent  Social Connections: Unknown (10/15/2023)   Social Connection and Isolation Panel    Frequency of Communication with Friends and Family: More than three times a week    Frequency of Social Gatherings with Friends and Family: Three times a week    Attends Religious Services: More than 4 times per year    Active Member of Clubs or Organizations: Yes    Attends Engineer, structural: More than 4 times per year    Marital Status: Not on file    Tobacco Counseling Counseling given: Not Answered   Clinical Intake:  Pre-visit preparation completed: Yes  Pain : No/denies pain     Diabetes: No  How often do you need to have someone help you when you read instructions, pamphlets, or other written materials from your doctor or pharmacy?: 1 - Never  Interpreter Needed?: No  Information entered by :: Mliss Graff LPN   Activities of Daily Living    10/15/2023   12:42 PM  In your present state of health, do you have any difficulty performing the following activities:  Hearing? 1  Vision? 0  Difficulty  concentrating or making decisions? 0  Walking or climbing stairs? 0  Dressing or bathing? 0  Doing errands, shopping? 0  Preparing Food and eating ? N  Using the Toilet? N  In the past six months, have you accidently leaked urine? N  Do you have problems with loss of bowel control? N  Managing your Medications? N  Managing your Finances? N  Housekeeping or managing your Housekeeping? N    Patient Care Team: Aletha Bene, MD as PCP - General (Family Medicine) Tobb, Kardie, DO as PCP - Cardiology (Cardiology) Mona Vinie BROCKS, MD as Consulting Physician (Cardiology) Christy Sandor GAILS, DO as Consulting Physician (Gastroenterology) Christy Standing, MD as Consulting Physician (General Surgery)  Indicate any recent Medical Services you may have received from other than [facility] providers in the past year (date may be approximate).     Assessment:   This is a routine wellness examination for Christy Jenkins.  Hearing/Vision screen Hearing Screening - Comments:: [facility] hearing aids Vision Screening - Comments:: [facility] Up to date   Goals Addressed             This Visit's Progress    Patient Stated       Stay healthy       Depression Screen    10/15/2023   12:35 PM  PHQ 2/9 Scores  PHQ - 2 Score 2  PHQ- 9 Score 9    Fall Risk    10/15/2023   12:28 PM  Fall Risk   Falls in the past year? 0  Number falls in past yr: 0  Injury with Fall? 0  Follow up Falls evaluation completed;Education provided;Falls prevention discussed    MEDICARE RISK AT HOME: Medicare Risk at Home Any stairs in or around the home?: Yes If so, are there any without handrails?: No Home free of loose throw rugs in walkways, pet beds, electrical cords, etc?: Yes Adequate lighting  in your home to reduce risk of falls?: Yes Life alert?: No Use of a cane, walker or w/c?: No Grab bars in the bathroom?: No Shower chair or bench in shower?: No Elevated toilet seat or a handicapped  toilet?: No  TIMED UP AND GO:  Was the test performed?  No    Cognitive Function:        10/15/2023   12:55 PM 10/15/2023   12:31 PM  6CIT Screen  What Year? 0 points 0 points  What month? 0 points 0 points  What time? 0 points 0 points  Count back from 20 0 points 0 points  Months in reverse 0 points 0 points  Repeat phrase 0 points 0 points  Total Score 0 points 0 points    Immunizations Immunization History  Administered Date(s) Administered   Fluad Quad(high Dose 65+) 01/17/2020, 01/15/2022   Influenza, High Dose Seasonal PF 01/28/2018   Influenza, Seasonal, Injecte, Preservative Fre 11/28/2010   Influenza,inj,Quad PF,6+ Mos 02/05/2016, 12/15/2016   Influenza-Unspecified 01/23/2014, 01/13/2015, 01/16/2021   PFIZER(Purple Top)SARS-COV-2 Vaccination 05/23/2019, 06/13/2019, 12/08/2019, 01/04/2020   Pneumococcal Conjugate-13 01/08/2015   Pneumococcal Polysaccharide-23 03/16/2017   Tdap 02/17/2011, 11/17/2020   Zoster Recombinant(Shingrix) 01/17/2019, 06/27/2019   Zoster, Live 02/19/2012    TDAP status: Up to date  Flu Vaccine status: Up to date  Pneumococcal vaccine status: Up to date  Covid-19 vaccine status: Declined, Education has been provided regarding the importance of this vaccine but patient still declined. Advised may receive this vaccine at local pharmacy or Health Dept.or vaccine clinic. Aware to provide a copy of the vaccination record if obtained from local pharmacy or Health Dept. Verbalized acceptance and understanding.  Qualifies for Shingles Vaccine? No   Zostavax completed Yes   Shingrix Completed?: Yes  Screening Tests Health Maintenance  Topic Date Due   Hepatitis C Screening  Never done   INFLUENZA VACCINE  10/30/2023   Medicare Annual Wellness (AWV)  10/14/2024   MAMMOGRAM  03/01/2025   Colonoscopy  02/10/2029   DTaP/Tdap/Td (3 - Td or Tdap) 11/18/2030   Pneumococcal Vaccine: 50+ Years  Completed   DEXA SCAN  Completed   Zoster  Vaccines- Shingrix  Completed   Hepatitis B Vaccines  Aged Out   HPV VACCINES  Aged Out   Meningococcal B Vaccine  Aged Out   COVID-19 Vaccine  Discontinued    Health Maintenance  Health Maintenance Due  Topic Date Due   Hepatitis C Screening  Never done    Colorectal cancer screening: Type of screening: Colonoscopy. Completed 2025. Repeat every 5 years  Mammogram status: Completed  . Repeat every year  Bone Density status: Completed 2022. Results reflect: Bone density results: NORMAL. Repeat every 5 years.  Lung Cancer Screening: (Low Dose CT Chest recommended if Age 65-80 years, 20 pack-year currently smoking OR have quit w/in 15years.) does not qualify.   Lung Cancer Screening Referral:   Additional Screening:  Hepatitis C Screening never done  Vision Screening: Recommended annual ophthalmology exams for early detection of glaucoma and other disorders of the eye. Is the patient up to date with their annual eye exam?  Yes  Who is the provider or what is the name of the office in which the patient attends annual eye exams? [facility] If pt is not established with a provider, would they like to be referred to a provider to establish care? No .   Dental Screening: Recommended annual dental exams for proper oral hygiene  Community Resource Referral / Chronic Care Management: CRR required this visit?  No   CCM required this visit?  No     Plan:     I have personally reviewed and noted the following in the patient's chart:   Medical and social history Use of alcohol, tobacco or illicit drugs  Current medications and supplements including opioid prescriptions. Patient is not currently taking opioid prescriptions. Functional ability and status Nutritional status Physical activity Advanced directives List of other physicians Hospitalizations, surgeries, and ER visits in previous 12 months Vitals Screenings to include cognitive, depression, and  falls Referrals and appointments  In addition, I have reviewed and discussed with patient certain preventive protocols, quality metrics, and best practice recommendations. A written personalized care plan for preventive services as well as general preventive health recommendations were provided to patient.     Mliss Graff, LPN   [id number]   After Visit Summary: (MyChart) Due to this being a telephonic visit, the after visit summary with patients personalized plan was offered to patient via MyChart   Nurse Notes:

## 2023-10-15 NOTE — Patient Instructions (Signed)
 Christy Jenkins , Thank you for taking time to come for your Medicare Wellness Visit. I appreciate your ongoing commitment to your health goals. Please review the following plan we discussed and let me know if I can assist you in the future.   Screening recommendations/referrals: Colonoscopy:  Mammogram:  Bone Density:  Recommended yearly ophthalmology/optometry visit for glaucoma screening and checkup Recommended yearly dental visit for hygiene and checkup  Vaccinations: Influenza vaccine:  Pneumococcal vaccine:  Tdap vaccine:  Shingles vaccine:       Preventive Care 65 Years and Older, Female Preventive care refers to lifestyle choices and visits with your health care provider that can promote health and wellness. What does preventive care include? A yearly physical exam. This is also called an annual well check. Dental exams once or twice a year. Routine eye exams. Ask your health care provider how often you should have your eyes checked. Personal lifestyle choices, including: Daily care of your teeth and gums. Regular physical activity. Eating a healthy diet. Avoiding tobacco and drug use. Limiting alcohol use. Practicing safe sex. Taking low-dose aspirin  every day. Taking vitamin and mineral supplements as recommended by your health care provider. What happens during an annual well check? The services and screenings done by your health care provider during your annual well check will depend on your age, overall health, lifestyle risk factors, and family history of disease. Counseling  Your health care provider may ask you questions about your: Alcohol use. Tobacco use. Drug use. Emotional well-being. Home and relationship well-being. Sexual activity. Eating habits. History of falls. Memory and ability to understand (cognition). Work and work Astronomer. Reproductive health. Screening  You may have the following tests or measurements: Height, weight, and BMI. Blood  pressure. Lipid and cholesterol levels. These may be checked every 5 years, or more frequently if you are over 63 years old. Skin check. Lung cancer screening. You may have this screening every year starting at age 25 if you have a 30-pack-year history of smoking and currently smoke or have quit within the past 15 years. Fecal occult blood test (FOBT) of the stool. You may have this test every year starting at age 46. Flexible sigmoidoscopy or colonoscopy. You may have a sigmoidoscopy every 5 years or a colonoscopy every 10 years starting at age 69. Hepatitis C blood test. Hepatitis B blood test. Sexually transmitted disease (STD) testing. Diabetes screening. This is done by checking your blood sugar (glucose) after you have not eaten for a while (fasting). You may have this done every 1-3 years. Bone density scan. This is done to screen for osteoporosis. You may have this done starting at age 52. Mammogram. This may be done every 1-2 years. Talk to your health care provider about how often you should have regular mammograms. Talk with your health care provider about your test results, treatment options, and if necessary, the need for more tests. Vaccines  Your health care provider may recommend certain vaccines, such as: Influenza vaccine. This is recommended every year. Tetanus, diphtheria, and acellular pertussis (Tdap, Td) vaccine. You may need a Td booster every 10 years. Zoster vaccine. You may need this after age 39. Pneumococcal 13-valent conjugate (PCV13) vaccine. One dose is recommended after age 8. Pneumococcal polysaccharide (PPSV23) vaccine. One dose is recommended after age 16. Talk to your health care provider about which screenings and vaccines you need and how often you need them. This information is not intended to replace advice given to you by your health care  provider. Make sure you discuss any questions you have with your health care provider. Document Released:  04/13/2015 Document Revised: 12/05/2015 Document Reviewed: 01/16/2015 Elsevier Interactive Patient Education  2017 [employer].  Fall Prevention in the Home Falls can cause injuries. They can happen to people of all ages. There are many things you can do to make your home safe and to help prevent falls. What can I do on the outside of my home? Regularly fix the edges of walkways and driveways and fix any cracks. Remove anything that might make you trip as you walk through a door, such as a raised step or threshold. Trim any bushes or trees on the path to your home. Use bright outdoor lighting. Clear any walking paths of anything that might make someone trip, such as rocks or tools. Regularly check to see if handrails are loose or broken. Make sure that both sides of any steps have handrails. Any raised decks and porches should have guardrails on the edges. Have any leaves, snow, or ice cleared regularly. Use sand or salt on walking paths during winter. Clean up any spills in your garage right away. This includes oil or grease spills. What can I do in the bathroom? Use night lights. Install grab bars by the toilet and in the tub and shower. Do not use towel bars as grab bars. Use non-skid mats or decals in the tub or shower. If you need to sit down in the shower, use a plastic, non-slip stool. Keep the floor dry. Clean up any water that spills on the floor as soon as it happens. Remove soap buildup in the tub or shower regularly. Attach bath mats securely with double-sided non-slip rug tape. Do not have throw rugs and other things on the floor that can make you trip. What can I do in the bedroom? Use night lights. Make sure that you have a light by your bed that is easy to reach. Do not use any sheets or blankets that are too big for your bed. They should not hang down onto the floor. Have a firm chair that has side arms. You can use this for support while you get dressed. Do not have  throw rugs and other things on the floor that can make you trip. What can I do in the kitchen? Clean up any spills right away. Avoid walking on wet floors. Keep items that you use a lot in easy-to-reach places. If you need to reach something above you, use a strong step stool that has a grab bar. Keep electrical cords out of the way. Do not use floor polish or wax that makes floors slippery. If you must use wax, use non-skid floor wax. Do not have throw rugs and other things on the floor that can make you trip. What can I do with my stairs? Do not leave any items on the stairs. Make sure that there are handrails on both sides of the stairs and use them. Fix handrails that are broken or loose. Make sure that handrails are as long as the stairways. Check any carpeting to make sure that it is firmly attached to the stairs. Fix any carpet that is loose or worn. Avoid having throw rugs at the top or bottom of the stairs. If you do have throw rugs, attach them to the floor with carpet tape. Make sure that you have a light switch at the top of the stairs and the bottom of the stairs. If you do not have  them, ask someone to add them for you. What else can I do to help prevent falls? Wear shoes that: Do not have high heels. Have rubber bottoms. Are comfortable and fit you well. Are closed at the toe. Do not wear sandals. If you use a stepladder: Make sure that it is fully opened. Do not climb a closed stepladder. Make sure that both sides of the stepladder are locked into place. Ask someone to hold it for you, if possible. Clearly mark and make sure that you can see: Any grab bars or handrails. First and last steps. Where the edge of each step is. Use tools that help you move around (mobility aids) if they are needed. These include: Canes. Walkers. Scooters. Crutches. Turn on the lights when you go into a dark area. Replace any light bulbs as soon as they burn out. Set up your furniture so  you have a clear path. Avoid moving your furniture around. If any of your floors are uneven, fix them. If there are any pets around you, be aware of where they are. Review your medicines with your doctor. Some medicines can make you feel dizzy. This can increase your chance of falling. Ask your doctor what other things that you can do to help prevent falls. This information is not intended to replace advice given to you by your health care provider. Make sure you discuss any questions you have with your health care provider. Document Released: 01/11/2009 Document Revised: 08/23/2015 Document Reviewed: 04/21/2014 Elsevier Interactive Patient Education  2017 [employer].

## 2023-11-02 ENCOUNTER — Encounter: Payer: Self-pay | Admitting: Family Medicine

## 2023-11-03 ENCOUNTER — Other Ambulatory Visit: Payer: Self-pay

## 2023-11-03 MED ORDER — NYSTATIN 100000 UNIT/GM EX POWD: 1.0000 | Freq: Three times a day (TID) | CUTANEOUS | 0 refills | Status: DC

## 2023-11-03 MED ORDER — FLUCONAZOLE 150 MG PO TABS: 150.0000 mg | ORAL_TABLET | Freq: Every day | ORAL | 0 refills | Status: DC

## 2023-11-23 ENCOUNTER — Ambulatory Visit (INDEPENDENT_AMBULATORY_CARE_PROVIDER_SITE_OTHER): Admitting: Family Medicine

## 2023-11-23 ENCOUNTER — Encounter: Payer: Self-pay | Admitting: Family Medicine

## 2023-11-23 VITALS — BP 120/68 | HR 60 | Temp 98.5°F | Ht 63.0 in | Wt 183.2 lb

## 2023-11-23 DIAGNOSIS — E782 Mixed hyperlipidemia: Secondary | ICD-10-CM

## 2023-11-23 DIAGNOSIS — R209 Unspecified disturbances of skin sensation: Secondary | ICD-10-CM

## 2023-11-23 DIAGNOSIS — I10 Essential (primary) hypertension: Secondary | ICD-10-CM

## 2023-11-23 DIAGNOSIS — R7309 Other abnormal glucose: Secondary | ICD-10-CM

## 2023-11-23 DIAGNOSIS — K219 Gastro-esophageal reflux disease without esophagitis: Secondary | ICD-10-CM

## 2023-11-23 DIAGNOSIS — F419 Anxiety disorder, unspecified: Secondary | ICD-10-CM | POA: Diagnosis not present

## 2023-11-23 DIAGNOSIS — R5383 Other fatigue: Secondary | ICD-10-CM

## 2023-11-23 DIAGNOSIS — G4709 Other insomnia: Secondary | ICD-10-CM

## 2023-11-23 MED ORDER — ESCITALOPRAM OXALATE 20 MG PO TABS: 20.0000 mg | ORAL_TABLET | Freq: Every day | ORAL | 1 refills | Status: DC

## 2023-11-23 NOTE — Progress Notes (Signed)
 Patient Office Visit  Assessment & Plan:  Essential (primary) hypertension -     CBC with Differential/Platelet -     Comprehensive metabolic panel with GFR  Anxiety -     Escitalopram  Oxalate; Take 1 tablet (20 mg total) by mouth at bedtime.  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 Gabapentin  may contribute to cognitive issues. She is considering reducing gabapentin  to evaluate its effect on cognition. - Reduce gabapentin  to 400 mg at night for one month to assess impact on cognition. - Monitor for worsening of sleep and leg symptoms with gabapentin  reduction.  Restless legs syndrome and leg pain Chronic restless legs syndrome and leg pain managed with gabapentin . Concern about cognitive side effects. - Monitor leg symptoms with gabapentin  reduction. - Consider returning to previous gabapentin  dose if leg symptoms worsen.  Depression/anxiety Depression managed with Lexapro , effective. Fatigue and sleep disturbances may relate to depression or medications.  Muscle spasms Intermittent severe muscle spasms, particularly in legs. Uses tizanidine  and Robaxin  as needed, prefers to avoid frequent use. - Continue tizanidine  and Robaxin  as needed for muscle spasms.      She will reduce the gabapentin  to 400 mg nightly.  If her leg symptoms worsen she may need to go back to the 800 nightly.  Alternatively she may need to increase the gabapentin  to 1200 mg at night if insomnia and  leg discomfort worsens over time.  Return in the next 3 to 4 months or sooner if necessary. Test results were reviewed and analyzed as part of the medical decision making of this visit.  Reviewed notes from Dr. Charlyne her neurologist in [facility].  Patient does have follow-up with him next month.  Return in about 3 months (around 02/23/2024), or if symptoms worsen or fail to improve.   Subjective:    Patient ID: Christy Jenkins, female    DOB: April 20, 1950  Age: 73 y.o. MRN: [id number]  No chief complaint on file.   HPI Discussed the use of AI scribe software for clinical note transcription with the patient, who gave verbal consent to proceed.  History of Present Illness        Christy Jenkins is a 72 year old female who presents with sleep disturbances and vivid dreams, fatigue, insomnia, and leg issues/possible RLS.   She experiences persistent sleep disturbances characterized by difficulty falling asleep and staying asleep. Her sleep is often disrupted by vivid, colorful dreams that are distressing and memorable, such as a recent dream involving a car accident and transformation of a person into a child. These dreams have been worsening over time.  Patient is frustrated that she has to take all these medications and wonders how much the medicationss are contributing to her current symptoms.  She is currently taking Lexapro , which she believes may contribute to her sleep issues, and gabapentin  800 mg at night, which she  uses for sleep and leg discomfort. She has a history of muscle spasms and leg pain, which sometimes interfere with her sleep and daily activities. Her leg pain has been worsening, requiring her to take breaks and use a heating pad during activities like doing laundry.  Patient is taking Robaxin  during the day and tizanidine  at night.  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 She had laser work done to stimulate bone  growth and is scheduled for grafts in two to three weeks. The cost of the dental procedures is a concern for her.  She is frustrated with the number of medications she is taking and is concerned about potential side effects, including cognitive issues such as word-finding difficulties. She has been taking gabapentin  for years and is unsure if it contributes to her cognitive symptoms.  She has been trying to improve her diet by increasing protein intake and eating cleaner, but she has not noticed any weight loss despite her efforts. She reports being tired all the time and sometimes experiences word-finding problems. Physical Exam HEENT: No cerumen impaction. 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 Gabapentin  may contribute to cognitive issues. She is considering reducing gabapentin  to evaluate its effect on cognition. - Reduce gabapentin  to 400 mg at night for one month to assess impact on cognition. - Monitor for worsening of sleep and leg symptoms with gabapentin  reduction.  Patient may need to do more neuroimaging per last neurology note.  Restless legs syndrome and leg pain Chronic restless legs syndrome and leg pain managed with gabapentin . Concern about cognitive side effects. - Monitor leg symptoms with gabapentin  reduction. - Consider returning to previous gabapentin  dose if leg symptoms worsen.  Depression/anxiety Depression managed with Lexapro , effective. Fatigue and sleep disturbances may relate to depression or medications.  Muscle spasms Intermittent severe muscle spasms, particularly in legs. Uses tizanidine  and Robaxin  as needed, prefers to avoid frequent use. - Continue tizanidine  and Robaxin  as needed for muscle spasms.    The ASCVD Risk score (Arnett DK, et al., 2019) failed to calculate for the following reasons:   Risk score cannot be calculated because  patient has a medical history suggesting prior/existing ASCVD  Past Medical History:  Diagnosis Date   Allergy    Anxiety and depression    Arthritis    Asthma    Cataract    Coronary artery disease    mild   Disease of eye characterized by increased eye pressure    Dysrhythmia    brady   GERD (gastroesophageal reflux disease)    Glaucoma    Headache    mirgaines   Heart murmur    Hyperlipidemia    Do not remember date but at least 40 years   Hypertension    Pneumonia    Stroke [facility])    Tremors of nervous system    Past Surgical History:  Procedure Laterality Date   ABDOMINAL HYSTERECTOMY     APPENDECTOMY     BLADDER SUSPENSION  2001   CATHETER REMOVAL     CHOLECYSTECTOMY     ESOPHAGEAL MANOMETRY N/A 09/06/2021   Procedure: ESOPHAGEAL MANOMETRY (EM);  Surgeon: San Sandor GAILS, DO;  Location: [facility];  Service: Gastroenterology;  Laterality: N/A;   EYE SURGERY     FRACTURE SURGERY     HERNIA REPAIR     LUMBAR DISC  SURGERY     right foot surgery     rods and pins   SPINE SURGERY     TONSILLECTOMY     TUBAL LIGATION     WISDOM TOOTH EXTRACTION  1978   WRIST SURGERY Bilateral    XI ROBOTIC ASSISTED PARAESOPHAGEAL HERNIA REPAIR N/A 11/13/2021   Procedure: XI ROBOTIC ASSISTED PARAESOPHAGEAL HIATAL HERNIA REPAIR WITH FUNDOPLICATION, BILATERAL TAP BLOCK;  Surgeon: Sheldon Standing, MD;  Location: [facility];  Service: General;  Laterality: N/A;   Social History   Tobacco Use   Smoking status: Former    Types: Cigarettes  Vaping Use   Vaping status: Never Used  Substance Use Topics   Alcohol use: Yes    Comment: rare   Drug use: Never   Family History  Problem Relation Age of Onset   Heart attack Mother    Heart disease Mother    Hypertension Mother    Arthritis Mother    Asthma Mother    Cancer Mother    COPD Mother    Hearing loss Mother    Hyperlipidemia Mother    Vision loss Mother    Diabetes Father    Heart disease Father    Arrhythmia Father     Heart attack Father    Heart failure Father    Hypertension Father    Hyperlipidemia Father    Kidney disease Father    Obesity Father    Diabetes Brother    Colon polyps Brother    Cancer Maternal Grandmother    Cancer Maternal Grandfather    Hearing loss Maternal Grandfather    Hypertension Paternal Grandmother    Stroke Paternal Grandmother    Vision loss Paternal Grandmother    Asthma Sister    Miscarriages / Stillbirths Sister    Obesity Sister    Asthma Brother    Diabetes Brother    Heart disease Brother    Hyperlipidemia Brother    Hypertension Brother    Obesity Brother    COPD Maternal Uncle    Learning disabilities Daughter    Stomach cancer Neg Hx    Esophageal cancer Neg Hx    Allergies  Allergen Reactions   Dust Mite Extract Other (See Comments) and Cough   Molds & Smuts Cough, Itching, Other (See Comments) and Shortness Of Breath   Pollen Extract Cough, Itching, Rash, Other (See Comments) and Shortness Of Breath    Other Reaction(s): Bronchospasm   Statins     Severe muscle aches    Tree Extract Cough, Other (See Comments), Rash and Shortness Of Breath   Tape Rash and Itching    Paper tape okay  Other Reaction(s): Dermatitis    Paper tape okay    ROS    Objective:    BP 120/68   Pulse 60   Temp 98.5 F (36.9 C)   Ht 5' 3 (1.6 m)   Wt 183 lb 4 oz (83.1 kg)   SpO2 99%   BMI 32.46 kg/m  BP Readings from Last 3 Encounters:  11/23/23 120/68  07/15/23 122/82  06/15/23 120/74   Wt Readings from Last 3 Encounters:  11/23/23 183 lb 4 oz (83.1 kg)  10/15/23 182 lb (82.6 kg)  07/15/23 182 lb 6 oz (82.7 kg)    Physical Exam Vitals and nursing note reviewed.  Constitutional:      Appearance: Normal appearance.  HENT:     Head: Normocephalic.     Right Ear: Tympanic membrane, ear canal and external ear  normal.     Left Ear: Tympanic membrane, ear canal and external ear normal.  Eyes:     Extraocular Movements: Extraocular movements  intact.     Conjunctiva/sclera: Conjunctivae normal.     Pupils: Pupils are equal, round, and reactive to light.  Cardiovascular:     Rate and Rhythm: Normal rate and regular rhythm.     Heart sounds: Normal heart sounds.  Pulmonary:     Effort: Pulmonary effort is normal.     Breath sounds: Normal breath sounds.  Musculoskeletal:     Right lower leg: No edema.     Left lower leg: No edema.  Neurological:     General: No focal deficit present.     Mental Status: She is alert and oriented to person, place, and time.  Psychiatric:        Mood and Affect: Mood normal.        Behavior: Behavior normal.        Thought Content: Thought content normal.        Judgment: Judgment normal.      No results found for any visits on 11/23/23.

## 2023-11-24 ENCOUNTER — Ambulatory Visit: Payer: Self-pay | Admitting: Family Medicine

## 2023-11-24 ENCOUNTER — Other Ambulatory Visit: Payer: Self-pay

## 2023-11-24 LAB — COMPREHENSIVE METABOLIC PANEL WITH GFR
AG Ratio: 1.7 (calc) (ref 1.0–2.5)
ALT: 14 U/L (ref 6–29)
AST: 22 U/L (ref 10–35)
Albumin: 3.9 g/dL (ref 3.6–5.1)
Alkaline phosphatase (APISO): 57 U/L (ref 37–153)
BUN: 13 mg/dL (ref 7–25)
CO2: 28 mmol/L (ref 20–32)
Calcium: 9.2 mg/dL (ref 8.6–10.4)
Chloride: 104 mmol/L (ref 98–110)
Creat: 0.68 mg/dL (ref 0.60–1.00)
Globulin: 2.3 g/dL (ref 1.9–3.7)
Glucose, Bld: 80 mg/dL (ref 65–99)
Potassium: 3.8 mmol/L (ref 3.5–5.3)
Sodium: 140 mmol/L (ref 135–146)
Total Bilirubin: 0.3 mg/dL (ref 0.2–1.2)
Total Protein: 6.2 g/dL (ref 6.1–8.1)
eGFR: 92 mL/min/1.73m2 (ref >=60)

## 2023-11-24 LAB — CBC WITH DIFFERENTIAL/PLATELET
Absolute Lymphocytes: 2244 {cells}/uL (ref 850–3900)
Absolute Monocytes: 552 {cells}/uL (ref 200–950)
Basophils Absolute: 62 {cells}/uL (ref 0–200)
Basophils Relative: 1 %
Eosinophils Absolute: 453 {cells}/uL (ref 15–500)
Eosinophils Relative: 7.3 %
HCT: 39.2 % (ref 35.0–45.0)
Hemoglobin: 12.7 g/dL (ref 11.7–15.5)
MCH: 29.3 pg (ref 27.0–33.0)
MCHC: 32.4 g/dL (ref 32.0–36.0)
MCV: 90.5 fL (ref 80.0–100.0)
MPV: 9.3 fL (ref 7.5–12.5)
Monocytes Relative: 8.9 %
Neutro Abs: 2889 {cells}/uL (ref 1500–7800)
Neutrophils Relative %: 46.6 %
Platelets: 208 Thousand/uL (ref 140–400)
RBC: 4.33 Million/uL (ref 3.80–5.10)
RDW: 12.8 % (ref 11.0–15.0)
Total Lymphocyte: 36.2 %
WBC: 6.2 Thousand/uL (ref 3.8–10.8)

## 2023-11-24 LAB — VITAMIN D 25 HYDROXY (VIT D DEFICIENCY, FRACTURES): Vit D, 25-Hydroxy: 49 ng/mL (ref 30–100)

## 2023-11-24 LAB — HEMOGLOBIN A1C
Hgb A1c MFr Bld: 5.7 % — ABNORMAL HIGH (ref <5.7)
Mean Plasma Glucose: 117 mg/dL
eAG (mmol/L): 6.5 mmol/L

## 2023-11-24 LAB — VITAMIN B12: Vitamin B-12: 1339 pg/mL — ABNORMAL HIGH (ref 200–1100)

## 2023-11-24 LAB — LIPID PANEL
Cholesterol: 165 mg/dL (ref <200)
HDL: 71 mg/dL (ref >=50)
LDL Cholesterol (Calc): 75 mg/dL
Non-HDL Cholesterol (Calc): 94 mg/dL (ref <130)
Total CHOL/HDL Ratio: 2.3 (calc) (ref <5.0)
Triglycerides: 105 mg/dL (ref <150)

## 2023-11-24 LAB — TSH: TSH: 1.97 m[IU]/L (ref 0.40–4.50)

## 2023-11-24 MED ORDER — METFORMIN HCL ER 500 MG PO TB24: 500.0000 mg | ORAL_TABLET | Freq: Every day | ORAL | 1 refills | Status: DC

## 2023-12-03 ENCOUNTER — Other Ambulatory Visit: Payer: Self-pay

## 2023-12-03 ENCOUNTER — Encounter: Payer: Self-pay | Admitting: Family Medicine

## 2023-12-03 MED ORDER — VALACYCLOVIR HCL 1 G PO TABS: ORAL_TABLET | ORAL | 5 refills | Status: AC

## 2023-12-07 ENCOUNTER — Other Ambulatory Visit: Payer: Self-pay | Admitting: Family Medicine

## 2023-12-07 NOTE — Telephone Encounter (Signed)
 Prescription Request  12/07/2023  LOV: 11/23/2023  What is the name of the medication or equipment? fluticasone -salmeterol (ADVAIR) 250-50 MCG/ACT AEPB   Have you contacted your pharmacy to request a refill? Yes   Which pharmacy would you like this sent to?  [facility] - [place], [place] - [address] [place] [address] Phone: [phone] Fax: [phone]    Patient notified that their request is being sent to the clinical staff for review and that they should receive a response within 2 business days.   Please advise at [facility] [phone]

## 2023-12-08 NOTE — Telephone Encounter (Signed)
 Requested medication (s) are due for refill today:   Requested medication (s) are on the active medication list: Yes  Last refill:  10/27/23  Future visit scheduled: Yes  Notes to clinic:  Historical provider.    Requested Prescriptions  Pending Prescriptions Disp Refills   fluticasone -salmeterol (ADVAIR) 250-50 MCG/ACT AEPB      Sig: Inhale 1 puff into the lungs 2 (two) times daily.     Pulmonology:  Combination Products Passed - 12/08/2023 12:57 PM      Passed - Valid encounter within last 12 months    Recent Outpatient Visits           2 weeks ago Essential (primary) hypertension   [place] [facility] Aletha Bene, MD   4 months ago Essential (primary) hypertension   [place] [facility] Aletha Bene, MD

## 2023-12-08 NOTE — Telephone Encounter (Signed)
 Requested medication (s) are due for refill today:   Provider to review  Requested medication (s) are on the active medication list:   Yes as historical  Future visit scheduled:   Yes 11/24 with Dr. Aletha   Last ordered: 10/27/2023 as historical  Unable to refill because last prescribed by another provider.     Requested Prescriptions  Pending Prescriptions Disp Refills   fluticasone -salmeterol (ADVAIR) 250-50 MCG/ACT AEPB      Sig: Inhale 1 puff into the lungs 2 (two) times daily.     Pulmonology:  Combination Products Passed - 12/08/2023  1:38 PM      Passed - Valid encounter within last 12 months    Recent Outpatient Visits           2 weeks ago Essential (primary) hypertension   [place] [facility] Aletha Bene, MD   4 months ago Essential (primary) hypertension   [place] [facility] Aletha Bene, MD

## 2023-12-09 ENCOUNTER — Encounter: Payer: Self-pay | Admitting: Gastroenterology

## 2023-12-09 MED ORDER — FLUTICASONE-SALMETEROL 250-50 MCG/ACT IN AEPB: 1.0000 | INHALATION_SPRAY | Freq: Two times a day (BID) | RESPIRATORY_TRACT | 5 refills | Status: DC

## 2023-12-09 NOTE — Telephone Encounter (Signed)
 Sent to provider for review and acknowledgement

## 2023-12-10 ENCOUNTER — Encounter: Payer: Self-pay | Admitting: Gastroenterology

## 2023-12-30 ENCOUNTER — Encounter: Payer: Self-pay | Admitting: Family Medicine

## 2024-01-18 ENCOUNTER — Ambulatory Visit (AMBULATORY_SURGERY_CENTER)

## 2024-01-18 VITALS — Ht 63.0 in | Wt 178.0 lb

## 2024-01-18 DIAGNOSIS — Z860100 Personal history of colon polyps, unspecified: Secondary | ICD-10-CM

## 2024-01-18 DIAGNOSIS — Z800 Family history of malignant neoplasm of digestive organs: Secondary | ICD-10-CM

## 2024-01-18 MED ORDER — NA SULFATE-K SULFATE-MG SULF 17.5-3.13-1.6 GM/177ML PO SOLN: 1.0000 | Freq: Once | ORAL | 0 refills | Status: AC

## 2024-01-18 NOTE — Progress Notes (Signed)
 No issues known to pt with past sedation with any surgeries or procedures Patient denies ever being told they had issues or difficulty with intubation  No FH of Malignant Hyperthermia Pt is not on diet pills Pt is not on  home 02  Pt is not on blood thinners  Pt states major issues with chronic constipation; eats prunes and more fiber to correct; laxative  No A fib or A flutter Have any cardiac testing pending--no Pt instructed to use [email or web address].com or GoodRx for a price reduction on prep  Ambulates independently **Sister recently diagnosed with colon cancer, stage 3

## 2024-01-22 ENCOUNTER — Encounter: Payer: Self-pay | Admitting: Gastroenterology

## 2024-02-01 ENCOUNTER — Ambulatory Visit (AMBULATORY_SURGERY_CENTER): Admitting: Gastroenterology

## 2024-02-01 ENCOUNTER — Encounter: Payer: Self-pay | Admitting: Gastroenterology

## 2024-02-01 VITALS — BP 162/81 | HR 66 | Temp 97.2°F | Resp 17 | Ht 63.0 in | Wt 178.0 lb

## 2024-02-01 DIAGNOSIS — Z860100 Personal history of colon polyps, unspecified: Secondary | ICD-10-CM

## 2024-02-01 DIAGNOSIS — D122 Benign neoplasm of ascending colon: Secondary | ICD-10-CM | POA: Diagnosis not present

## 2024-02-01 DIAGNOSIS — K648 Other hemorrhoids: Secondary | ICD-10-CM

## 2024-02-01 DIAGNOSIS — Z800 Family history of malignant neoplasm of digestive organs: Secondary | ICD-10-CM

## 2024-02-01 DIAGNOSIS — K2970 Gastritis, unspecified, without bleeding: Secondary | ICD-10-CM

## 2024-02-01 DIAGNOSIS — Z9889 Other specified postprocedural states: Secondary | ICD-10-CM

## 2024-02-01 DIAGNOSIS — R1310 Dysphagia, unspecified: Secondary | ICD-10-CM

## 2024-02-01 DIAGNOSIS — Z1211 Encounter for screening for malignant neoplasm of colon: Secondary | ICD-10-CM

## 2024-02-01 DIAGNOSIS — K640 First degree hemorrhoids: Secondary | ICD-10-CM

## 2024-02-01 DIAGNOSIS — K219 Gastro-esophageal reflux disease without esophagitis: Secondary | ICD-10-CM

## 2024-02-01 DIAGNOSIS — K2960 Other gastritis without bleeding: Secondary | ICD-10-CM

## 2024-02-01 DIAGNOSIS — K5900 Constipation, unspecified: Secondary | ICD-10-CM

## 2024-02-01 DIAGNOSIS — D123 Benign neoplasm of transverse colon: Secondary | ICD-10-CM

## 2024-02-01 DIAGNOSIS — Z860101 Personal history of adenomatous and serrated colon polyps: Secondary | ICD-10-CM

## 2024-02-01 DIAGNOSIS — R194 Change in bowel habit: Secondary | ICD-10-CM

## 2024-02-01 MED ORDER — SODIUM CHLORIDE 0.9 % IV SOLN: 500.0000 mL | Freq: Once | INTRAVENOUS | Status: DC

## 2024-02-01 NOTE — Progress Notes (Signed)
 Vss nad trans to pacu

## 2024-02-01 NOTE — Progress Notes (Signed)
Updated medical record with pt

## 2024-02-01 NOTE — Progress Notes (Signed)
 GASTROENTEROLOGY PROCEDURE H&P NOTE   Primary Care Physician: Aletha Bene, MD    Reason for Procedure:   Dysphagia, GERD, colon polyp surveillance, Fhx colon CA  Plan:    EGD, colonoscopy   Patient is appropriate for endoscopic procedure(s) in the ambulatory ([facility]) setting.  The nature of the procedure, as well as the risks, benefits, and alternatives were carefully and thoroughly reviewed with the patient. Ample time for discussion and questions allowed. The patient understood, was satisfied, and agreed to proceed.     HPI: Christy Jenkins is a 73 y.o. female who presents for EGD for evaluation of GERD and dysphagia, along with colonoscopy for ongoing polyp surveillance.   Additionally, sister was recently diagnosed with Stage 3 rectal CA.   Longstanding history of reflux, and eventually went for robotic assisted paraesophageal hernia repair, mesh reinforcement of incarcerated hiatal hernia, and partial toupee fundoplication in 10/2021.  Initially had resolution of reflux symptoms, but started having breakthrough symptoms in 01/2023.  Barium esophagram showed intact fundoplication and no recurrence of hiatal hernia, but spontaneous moderate to large volume reflux into the lower 2/3rds of the esophagus.  Restarted Protonix  high-dose.   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    Dysrhythmia    brady   GERD (gastroesophageal reflux disease)    Glaucoma    Headache    mirgaines   Heart murmur    Hyperlipidemia    Do not remember date but at least 40 years   Hypertension    Pneumonia    Stroke [facility])    pt does not know when she had stroke; scan picked up that pt had stroke   Tremors of nervous system     Past Surgical History:  Procedure Laterality Date   ABDOMINAL HYSTERECTOMY      APPENDECTOMY     BLADDER SUSPENSION  2001   CATHETER REMOVAL     CHOLECYSTECTOMY     COLONOSCOPY     ESOPHAGEAL MANOMETRY N/A 09/06/2021   Procedure: ESOPHAGEAL MANOMETRY (EM);  Surgeon: San Sandor GAILS, DO;  Location: [facility];  Service: Gastroenterology;  Laterality: N/A;   EYE SURGERY     FRACTURE SURGERY     HERNIA REPAIR     LUMBAR DISC SURGERY     right foot surgery     rods and pins   SPINE SURGERY     TONSILLECTOMY     TUBAL LIGATION     UPPER GASTROINTESTINAL ENDOSCOPY     WISDOM TOOTH EXTRACTION  1978   WRIST SURGERY Bilateral    XI ROBOTIC ASSISTED PARAESOPHAGEAL HERNIA REPAIR N/A 11/13/2021   Procedure: XI ROBOTIC ASSISTED PARAESOPHAGEAL HIATAL HERNIA REPAIR WITH FUNDOPLICATION, BILATERAL TAP BLOCK;  Surgeon: Sheldon Standing, MD;  Location: [facility];  Service: General;  Laterality: N/A;    Prior to Admission medications   Medication Sig Start Date End Date Taking? Authorizing Provider  aspirin  EC 81 MG tablet Take 1 tablet (81 mg total) by mouth daily. Swallow whole. 01/31/21  Yes Tobb, Kardie, DO  CALCIUM CITRATE-VITAMIN D  PO Take 2 tablets by mouth at bedtime.   Yes [provider]  cetirizine (ZYRTEC) 10 MG tablet Take 10 mg by mouth daily as needed for allergies.   Yes  [provider]  Cholecalciferol 25 MCG (1000 UT) tablet Take 1,000 Units by mouth daily.   Yes [provider]  escitalopram  (LEXAPRO ) 20 MG tablet Take 1 tablet (20 mg total) by mouth at bedtime. 11/23/23  Yes Aletha Bene, MD  ezetimibe  (ZETIA ) 10 MG tablet Take 1 tablet (10 mg total) by mouth at bedtime. 07/15/23  Yes Aletha Bene, MD  fluticasone -salmeterol (ADVAIR) 250-50 MCG/ACT AEPB Inhale 1 puff into the lungs 2 (two) times daily. 12/09/23  Yes Aletha Bene, MD  latanoprost  (XALATAN ) 0.005 % ophthalmic solution Place 1 drop into both eyes at bedtime. 08/12/19  Yes [provider]  losartan  (COZAAR ) 100 MG tablet Take 1 tablet (100 mg total) by mouth  daily. 07/15/23  Yes Aletha Bene, MD  metFORMIN  (GLUCOPHAGE -XR) 500 MG 24 hr tablet Take 1 tablet (500 mg total) by mouth daily with breakfast. 11/24/23  Yes Aletha Bene, MD  montelukast  (SINGULAIR ) 10 MG tablet Take 1 tablet (10 mg total) by mouth at bedtime. 07/15/23  Yes Aletha Bene, MD  Multiple Vitamin (MULTI-VITAMIN) tablet Take 1 tablet by mouth daily. 05/17/08  Yes [provider]  ondansetron  (ZOFRAN ) 4 MG tablet Take 1 tablet (4 mg total) by mouth every 6 (six) hours as needed for nausea or vomiting. 02/20/23  Yes Demontray Franta V, DO  pantoprazole  (PROTONIX ) 40 MG tablet TAKE 1 TABLET BY MOUTH TWICE DAILY 08/11/23  Yes Brahim Dolman V, DO  primidone  (MYSOLINE ) 50 MG tablet Take 100 mg by mouth at bedtime. 03/06/16  Yes [provider]  REPATHA  SURECLICK 140 MG/ML SOAJ INJECT 140 MG INTO THE SKIN EVERY 14 DAYS. 06/15/23  Yes Tobb, Kardie, DO  zonisamide  (ZONEGRAN ) 100 MG capsule Take 100 mg by mouth at bedtime. 09/03/20  Yes [provider]  albuterol  (VENTOLIN  HFA) 108 (90 Base) MCG/ACT inhaler Inhale 1-2 puffs into the lungs every 4 (four) hours as needed for wheezing or shortness of breath. 02/26/16   [provider]  ALPRAZolam  (XANAX ) 0.25 MG tablet Take 1 tablet (0.25 mg total) by mouth 3 (three) times daily as needed for anxiety. 07/15/23   Aletha Bene, MD  methocarbamol  (ROBAXIN ) 500 MG tablet Take 500 mg by mouth every 8 (eight) hours as needed for muscle spasms. 12/12/20   [provider]  tiZANidine  (ZANAFLEX ) 2 MG tablet Take 2 mg by mouth every 8 (eight) hours as needed for muscle spasms. 09/03/20   [provider]  valACYclovir  (VALTREX ) 1000 MG tablet Take 2 grams twice daily for one day. 12/03/23   Aletha Bene, MD    Current Outpatient Medications  Medication Sig Dispense Refill   aspirin  EC 81 MG tablet Take 1 tablet (81 mg total) by mouth daily. Swallow whole. 90 tablet 3   CALCIUM CITRATE-VITAMIN D  PO Take  2 tablets by mouth at bedtime.     cetirizine (ZYRTEC) 10 MG tablet Take 10 mg by mouth daily as needed for allergies.     Cholecalciferol 25 MCG (1000 UT) tablet Take 1,000 Units by mouth daily.     escitalopram  (LEXAPRO ) 20 MG tablet Take 1 tablet (20 mg total) by mouth at bedtime. 90 tablet 1   ezetimibe  (ZETIA ) 10 MG tablet Take 1 tablet (10 mg total) by mouth at bedtime. 90 tablet 1   fluticasone -salmeterol (ADVAIR) 250-50 MCG/ACT AEPB Inhale 1 puff into the lungs 2 (two) times daily. 1 each 5   latanoprost  (XALATAN ) 0.005 % ophthalmic solution Place 1 drop into both eyes at bedtime.  losartan  (COZAAR ) 100 MG tablet Take 1 tablet (100 mg total) by mouth daily. 90 tablet 1   metFORMIN  (GLUCOPHAGE -XR) 500 MG 24 hr tablet Take 1 tablet (500 mg total) by mouth daily with breakfast. 90 tablet 1   montelukast  (SINGULAIR ) 10 MG tablet Take 1 tablet (10 mg total) by mouth at bedtime. 90 tablet 3   Multiple Vitamin (MULTI-VITAMIN) tablet Take 1 tablet by mouth daily.     ondansetron  (ZOFRAN ) 4 MG tablet Take 1 tablet (4 mg total) by mouth every 6 (six) hours as needed for nausea or vomiting. 60 tablet 3   pantoprazole  (PROTONIX ) 40 MG tablet TAKE 1 TABLET BY MOUTH TWICE DAILY 60 tablet 9   primidone  (MYSOLINE ) 50 MG tablet Take 100 mg by mouth at bedtime.     REPATHA  SURECLICK 140 MG/ML SOAJ INJECT 140 MG INTO THE SKIN EVERY 14 DAYS. 2 mL 11   zonisamide  (ZONEGRAN ) 100 MG capsule Take 100 mg by mouth at bedtime.     albuterol  (VENTOLIN  HFA) 108 (90 Base) MCG/ACT inhaler Inhale 1-2 puffs into the lungs every 4 (four) hours as needed for wheezing or shortness of breath.     ALPRAZolam  (XANAX ) 0.25 MG tablet Take 1 tablet (0.25 mg total) by mouth 3 (three) times daily as needed for anxiety. 30 tablet 0   methocarbamol  (ROBAXIN ) 500 MG tablet Take 500 mg by mouth every 8 (eight) hours as needed for muscle spasms.     tiZANidine  (ZANAFLEX ) 2 MG tablet Take 2 mg by mouth every 8 (eight) hours as needed  for muscle spasms.     valACYclovir  (VALTREX ) 1000 MG tablet Take 2 grams twice daily for one day. 4 tablet 5   Current Facility-Administered Medications  Medication Dose Route Frequency Provider Last Rate Last Admin   0.9 %  sodium chloride  infusion  500 mL Intravenous Once Marleen Moret V, DO        Allergies as of 02/01/2024 - Review Complete 02/01/2024  Allergen Reaction Noted   Dust mite extract Other (See Comments) and Cough 06/01/2023   Molds & smuts Shortness Of Breath, Itching, Other (See Comments), and Cough 06/01/2023   Pollen extract Shortness Of Breath, Itching, Rash, Other (See Comments), and Cough 06/23/2022   Statins Other (See Comments) 11/05/2020   Tree extract Shortness Of Breath, Rash, Other (See Comments), and Cough 06/01/2023   Tape Itching and Rash 03/06/2020    Family History  Problem Relation Age of Onset   Colon polyps Mother    Heart attack Mother    Heart disease Mother    Hypertension Mother    Arthritis Mother    Asthma Mother    Cancer Mother    COPD Mother    Hearing loss Mother    Hyperlipidemia Mother    Vision loss Mother    Diabetes Father    Heart disease Father    Arrhythmia Father    Heart attack Father    Heart failure Father    Hypertension Father    Hyperlipidemia Father    Kidney disease Father    Obesity Father    Rectal cancer Sister    Asthma Sister    Miscarriages / Stillbirths Sister    Obesity Sister    Diabetes Brother    Colon polyps Brother    Asthma Brother    Diabetes Brother    Heart disease Brother    Hyperlipidemia Brother    Hypertension Brother    Obesity Brother    COPD Maternal  Uncle    Cancer Maternal Grandmother    Cancer Maternal Grandfather    Hearing loss Maternal Grandfather    Hypertension Paternal Grandmother    Stroke Paternal Grandmother    Vision loss Paternal Grandmother    Learning disabilities Daughter    Stomach cancer Neg Hx    Esophageal cancer Neg Hx    Colon cancer Neg Hx      Social History   Socioeconomic History   Marital status: Single    Spouse name: Not on file   Number of children: Not on file   Years of education: Not on file   Highest education level: Associate degree: academic program  Occupational History   Not on file  Tobacco Use   Smoking status: Former    Types: Cigarettes   Smokeless tobacco: Never  Vaping Use   Vaping status: Never Used  Substance and Sexual Activity   Alcohol use: Yes    Comment: occasionally, socially   Drug use: Never   Sexual activity: Not Currently    Birth control/protection: Post-menopausal, Surgical  Other Topics Concern   Not on file  Social History Narrative   Patient working part time. Has 3 grandchildren (about to have 4th grandchild).    Social Drivers of Corporate Investment Banker Strain: Low Risk  (10/15/2023)   Overall Financial Resource Strain (CARDIA)    Difficulty of Paying Living Expenses: Not hard at all  Food Insecurity: No Food Insecurity (10/15/2023)   Hunger Vital Sign    Worried About Running Out of Food in the Last Year: Never true    Ran Out of Food in the Last Year: Never true  Transportation Needs: No Transportation Needs (10/15/2023)   PRAPARE - Administrator, Civil Service (Medical): No    Lack of Transportation (Non-Medical): No  Physical Activity: Inactive (10/15/2023)   Exercise Vital Sign    Days of Exercise per Week: 0 days    Minutes of Exercise per Session: 0 min  Stress: Stress Concern Present (10/15/2023)   [employer] of Occupational Health - Occupational Stress Questionnaire    Feeling of Stress: To some extent  Social Connections: Unknown (10/15/2023)   Social Connection and Isolation Panel    Frequency of Communication with Friends and Family: More than three times a week    Frequency of Social Gatherings with Friends and Family: Three times a week    Attends Religious Services: More than 4 times per year    Active Member of Clubs or  Organizations: Yes    Attends Banker Meetings: More than 4 times per year    Marital Status: Not on file  Intimate Partner Violence: Not At Risk (10/15/2023)   Humiliation, Afraid, Rape, and Kick questionnaire    Fear of Current or Ex-Partner: No    Emotionally Abused: No    Physically Abused: No    Sexually Abused: No    Physical Exam: Vital signs in last 24 hours: @BP  (!) 156/84   Pulse (!) 58   Temp (!) 97.2 F (36.2 C) (Temporal)   Ht 5' 3 (1.6 m)   Wt 178 lb (80.7 kg)   SpO2 96%   BMI 31.53 kg/m  GEN: NAD EYE: Sclerae anicteric ENT: MMM CV: Non-tachycardic Pulm: CTA b/l GI: Soft, NT/ND NEURO:  Alert & Oriented x 3   Sandor Flatter, DO [facility]   02/01/2024 3:06 PM

## 2024-02-01 NOTE — Op Note (Signed)
 [facility] Patient Name: Rosmary Dionisio Procedure Date: 02/01/2024 3:09 PM MRN: [id number] Endoscopist: Sandor Flatter , MD, [id number] Age: 73 Referring MD:  Date of Birth: 1950-10-29 Gender: Female Account #: [id number] Procedure:                Upper GI endoscopy Indications:              Dysphagia, Esophageal reflux                           Longstanding history of reflux, and eventually went                            for robotic assisted paraesophageal hernia repair,                            mesh reinforcement of incarcerated hiatal hernia,                            and partial toupee fundoplication in 10/2021. Medicines:                Monitored Anesthesia Care Procedure:                Pre-Anesthesia Assessment:                           - Prior to the procedure, a History and Physical                            was performed, and patient medications and                            allergies were reviewed. The patient's tolerance of                            previous anesthesia was also reviewed. The risks                            and benefits of the procedure and the sedation                            options and risks were discussed with the patient.                            All questions were answered, and informed consent                            was obtained. Prior Anticoagulants: The patient has                            taken no anticoagulant or antiplatelet agents. ASA                            Grade Assessment: III - A patient with severe  systemic disease. After reviewing the risks and                            benefits, the patient was deemed in satisfactory                            condition to undergo the procedure.                           After obtaining informed consent, the endoscope was                            passed under direct vision. Throughout the                            procedure, the patient's  blood pressure, pulse, and                            oxygen saturations were monitored continuously. The                            GIF [id number] #[id number] was introduced through the                            mouth, and advanced to the second part of duodenum.                            The upper GI endoscopy was accomplished without                            difficulty. The patient tolerated the procedure                            well. Scope In: Scope Out: Findings:                 The upper third of the esophagus, middle third of                            the esophagus and lower third of the esophagus were                            normal. No esophageal strictures or rings noted.                            Biopsies were taken with a cold forceps for                            histology. Estimated blood loss was minimal.                           Evidence of a Toupet fundoplication was found at  the gastroesophageal junction and in the cardia.                            There was very slight laxity at the anterior                            corner, but otherwise the wrap appeared intact.                            This was traversed.                           Patchy minimal inflammation characterized by                            congestion (edema) and erythema was found in the                            gastric body and in the gastric antrum. Biopsies                            were taken with a cold forceps for Helicobacter                            pylori testing. Estimated blood loss was minimal.                           The examined duodenum was normal. Complications:            No immediate complications. Estimated Blood Loss:     Estimated blood loss was minimal. Impression:               - Normal upper third of esophagus, middle third of                            esophagus and lower third of esophagus. Biopsied.                           - A Toupet  fundoplication was found. The wrap                            appears intact.                           - Mild, patchy, non-ulcer gastritis. Biopsied.                           - Normal examined duodenum. Recommendation:           - Patient has a contact number available for                            emergencies. The signs and symptoms of potential                            delayed complications  were discussed with the                            patient. Return to normal activities tomorrow.                            Written discharge instructions were provided to the                            patient.                           - Resume previous diet.                           - Continue present medications.                           - Await pathology results. Sandor Flatter, MD 02/01/2024 3:55:55 PM

## 2024-02-01 NOTE — Patient Instructions (Signed)
 Resume previous diet  Continue present medications  Await pathology results  See handout on polyps YOU HAD AN ENDOSCOPIC PROCEDURE TODAY AT THE [facility]:   Refer to the procedure report that was given to you for any specific questions about what was found during the examination.  If the procedure report does not answer your questions, please call your gastroenterologist to clarify.  If you requested that your care partner not be given the details of your procedure findings, then the procedure report has been included in a sealed envelope for you to review at your convenience later.  YOU SHOULD EXPECT: Some feelings of bloating in the abdomen. Passage of more gas than usual.  Walking can help get rid of the air that was put into your GI tract during the procedure and reduce the bloating. If you had a lower endoscopy (such as a colonoscopy or flexible sigmoidoscopy) you may notice spotting of blood in your stool or on the toilet paper. If you underwent a bowel prep for your procedure, you may not have a normal bowel movement for a few days.  Please Note:  You might notice some irritation and congestion in your nose or some drainage.  This is from the oxygen used during your procedure.  There is no need for concern and it should clear up in a day or so.  SYMPTOMS TO REPORT IMMEDIATELY: Following lower endoscopy (colonoscopy or flexible sigmoidoscopy):  Excessive amounts of blood in the stool  Significant tenderness or worsening of abdominal pains  Swelling of the abdomen that is new, acute  Fever of 100F or higher Following upper endoscopy (EGD)  Vomiting of blood or coffee ground material  New chest pain or pain under the shoulder blades  Painful or persistently difficult swallowing  New shortness of breath  Black, tarry-looking stools  For urgent or emergent issues, a gastroenterologist can be reached at any hour by calling [phone]. Do not use MyChart messaging for  urgent concerns.   DIET:  We do recommend a small meal at first, but then you may proceed to your regular diet.  Drink plenty of fluids but you should avoid alcoholic beverages for 24 hours.  ACTIVITY:  You should plan to take it easy for the rest of today and you should NOT DRIVE or use heavy machinery until tomorrow (because of the sedation medicines used during the test).    FOLLOW UP: Our staff will call the number listed on your records the next business day following your procedure.  We will call around 7:15- 8:00 am to check on you and address any questions or concerns that you may have regarding the information given to you following your procedure. If we do not reach you, we will leave a message.     If any biopsies were taken you will be contacted by phone or by letter within the next 1-3 weeks.  Please call us  at [phone] if you have not heard about the biopsies in 3 weeks.   SIGNATURES/CONFIDENTIALITY: You and/or your care partner have signed paperwork which will be entered into your electronic medical record.  These signatures attest to the fact that that the information above on your After Visit Summary has been reviewed and is understood.  Full responsibility of the confidentiality of this discharge information lies with you and/or your care-partner.

## 2024-02-01 NOTE — Progress Notes (Signed)
 Called to room to assist during endoscopic procedure.  Patient ID and intended procedure confirmed with present staff. Received instructions for my participation in the procedure from the performing physician.

## 2024-02-01 NOTE — Op Note (Signed)
 [facility] Patient Name: Christy Jenkins Procedure Date: 02/01/2024 3:02 PM MRN: [id number] Endoscopist: Sandor Flatter , MD, [id number] Age: 73 Referring MD:  Date of Birth: 05-27-1950 Gender: Female Account #: [id number] Procedure:                Colonoscopy Indications:              Colon cancer screening in patient at increased                            risk: Colorectal cancer in sister                           High risk colon cancer surveillance: Personal                            history of colonic polyps                           Last Colonoscopy was 01/2019 and notable for                            transverse polyps 5-6 mm (HP), 4 mm rectal polyp                            (TA), IH. Repeat 5 years                           Her sister was recent diagnosed with Colorectal                            cancer as well. Medicines:                Monitored Anesthesia Care Procedure:                Pre-Anesthesia Assessment:                           - Prior to the procedure, a History and Physical                            was performed, and patient medications and                            allergies were reviewed. The patient's tolerance of                            previous anesthesia was also reviewed. The risks                            and benefits of the procedure and the sedation                            options and risks were discussed with the patient.  All questions were answered, and informed consent                            was obtained. Prior Anticoagulants: The patient has                            taken no anticoagulant or antiplatelet agents. ASA                            Grade Assessment: III - A patient with severe                            systemic disease. After reviewing the risks and                            benefits, the patient was deemed in satisfactory                            condition to undergo the  procedure.                           After obtaining informed consent, the colonoscope                            was passed under direct vision. Throughout the                            procedure, the patient's blood pressure, pulse, and                            oxygen saturations were monitored continuously. The                            Olympus Scope [id number] was introduced through the                            anus and advanced to the the terminal ileum. The                            colonoscopy was performed without difficulty. The                            patient tolerated the procedure well. The quality                            of the bowel preparation was good. The terminal                            ileum, ileocecal valve, appendiceal orifice, and                            rectum were photographed. Scope In: 3:30:14 PM Scope Out: 3:49:15 PM Scope Withdrawal Time: 0 hours 13 minutes 1 second  Total Procedure Duration:  0 hours 19 minutes 1 second  Findings:                 The perianal and digital rectal examinations were                            normal.                           A 3 mm polyp was found in the ascending colon. The                            polyp was sessile. The polyp was removed with a                            cold snare. Resection and retrieval were complete.                            Estimated blood loss was minimal.                           The exam was otherwise normal throughout the                            examined colon.                           Non-bleeding internal hemorrhoids were found during                            retroflexion. The hemorrhoids were small.                           The terminal ileum appeared normal. Complications:            No immediate complications. Estimated Blood Loss:     Estimated blood loss was minimal. Impression:               - One 3 mm polyp in the ascending colon, removed                             with a cold snare. Resected and retrieved.                           - Non-bleeding internal hemorrhoids.                           - The examined portion of the ileum was normal. Recommendation:           - Patient has a contact number available for                            emergencies. The signs and symptoms of potential                            delayed complications were discussed with the  patient. Return to normal activities tomorrow.                            Written discharge instructions were provided to the                            patient.                           - Resume previous diet.                           - Continue present medications.                           - Await pathology results.                           - Repeat colonoscopy in 5 years for surveillance                            based on pathology results.                           - Return to GI office PRN. Sandor Flatter, MD 02/01/2024 3:59:26 PM

## 2024-02-02 ENCOUNTER — Telehealth: Payer: Self-pay

## 2024-02-02 NOTE — Telephone Encounter (Signed)
  Follow up Call-     02/01/2024    2:25 PM 08/22/2021    8:56 AM  Call back number  Post procedure Call Back phone  # [phone]  Permission to leave phone message Yes Yes     Patient questions:  Do you have a fever, pain , or abdominal swelling? No. Pain Score  0 *  Have you tolerated food without any problems? Yes.    Have you been able to return to your normal activities? Yes.    Do you have any questions about your discharge instructions: Diet   No. Medications  No. Follow up visit  No.  Do you have questions or concerns about your Care? No.  Actions: * If pain score is 4 or above: No action needed, pain <4.

## 2024-02-04 LAB — SURGICAL PATHOLOGY

## 2024-02-05 ENCOUNTER — Ambulatory Visit: Payer: Self-pay | Admitting: Gastroenterology

## 2024-02-09 ENCOUNTER — Telehealth: Payer: Self-pay

## 2024-02-09 ENCOUNTER — Ambulatory Visit (INDEPENDENT_AMBULATORY_CARE_PROVIDER_SITE_OTHER): Admitting: Family Medicine

## 2024-02-09 ENCOUNTER — Encounter: Payer: Self-pay | Admitting: Family Medicine

## 2024-02-09 VITALS — BP 142/96 | HR 60 | Temp 98.0°F | Ht 63.0 in | Wt 176.0 lb

## 2024-02-09 DIAGNOSIS — F419 Anxiety disorder, unspecified: Secondary | ICD-10-CM

## 2024-02-09 DIAGNOSIS — M2550 Pain in unspecified joint: Secondary | ICD-10-CM

## 2024-02-09 DIAGNOSIS — J302 Other seasonal allergic rhinitis: Secondary | ICD-10-CM

## 2024-02-09 DIAGNOSIS — J4520 Mild intermittent asthma, uncomplicated: Secondary | ICD-10-CM

## 2024-02-09 DIAGNOSIS — G8929 Other chronic pain: Secondary | ICD-10-CM

## 2024-02-09 DIAGNOSIS — E782 Mixed hyperlipidemia: Secondary | ICD-10-CM

## 2024-02-09 DIAGNOSIS — I10 Essential (primary) hypertension: Secondary | ICD-10-CM | POA: Diagnosis not present

## 2024-02-09 DIAGNOSIS — Z23 Encounter for immunization: Secondary | ICD-10-CM

## 2024-02-09 DIAGNOSIS — R7309 Other abnormal glucose: Secondary | ICD-10-CM

## 2024-02-09 DIAGNOSIS — G4700 Insomnia, unspecified: Secondary | ICD-10-CM

## 2024-02-09 MED ORDER — LOSARTAN POTASSIUM-HCTZ 100-12.5 MG PO TABS: 1.0000 | ORAL_TABLET | Freq: Every day | ORAL | 1 refills | Status: AC

## 2024-02-09 MED ORDER — MONTELUKAST SODIUM 10 MG PO TABS: 10.0000 mg | ORAL_TABLET | Freq: Every day | ORAL | 3 refills | Status: AC

## 2024-02-09 MED ORDER — FLUTICASONE-SALMETEROL 250-50 MCG/ACT IN AEPB: 1.0000 | INHALATION_SPRAY | Freq: Two times a day (BID) | RESPIRATORY_TRACT | 5 refills | Status: AC

## 2024-02-09 MED ORDER — EZETIMIBE 10 MG PO TABS: 10.0000 mg | ORAL_TABLET | Freq: Every day | ORAL | 1 refills | Status: AC

## 2024-02-09 MED ORDER — PREGABALIN 25 MG PO CAPS: 25.0000 mg | ORAL_CAPSULE | Freq: Three times a day (TID) | ORAL | 0 refills | Status: DC

## 2024-02-09 MED ORDER — LOSARTAN POTASSIUM 100 MG PO TABS: 100.0000 mg | ORAL_TABLET | Freq: Every day | ORAL | 1 refills | Status: DC

## 2024-02-09 MED ORDER — METFORMIN HCL ER 500 MG PO TB24: 500.0000 mg | ORAL_TABLET | Freq: Every day | ORAL | 1 refills | Status: AC

## 2024-02-09 MED ORDER — ESCITALOPRAM OXALATE 20 MG PO TABS: 20.0000 mg | ORAL_TABLET | Freq: Every day | ORAL | 1 refills | Status: AC

## 2024-02-09 MED ORDER — ALPRAZOLAM 0.5 MG PO TABS: 0.5000 mg | ORAL_TABLET | Freq: Two times a day (BID) | ORAL | 0 refills | Status: AC | PRN

## 2024-02-09 MED ORDER — AMITRIPTYLINE HCL 25 MG PO TABS: 25.0000 mg | ORAL_TABLET | Freq: Every evening | ORAL | 1 refills | Status: AC | PRN

## 2024-02-09 NOTE — Telephone Encounter (Signed)
 Copied from CRM [id number]. Topic: Clinical - Prescription Issue >> Feb 09, 2024  9:36 AM Tonda B wrote: Reason for CRM: [facility] calling has question about rx losartan -hydrochlorothiazide (HYZAAR) 100-12.5 MG tablet please call back [phone]

## 2024-02-09 NOTE — Telephone Encounter (Signed)
 Spoke with pharmacist and clarified which medication pt was supposed to be on.

## 2024-02-09 NOTE — Progress Notes (Signed)
 Patient Office Visit  Assessment & Plan:  Essential (primary) hypertension -     Losartan  Potassium-HCTZ; Take 1 tablet by mouth daily.  Dispense: 90 tablet; Refill: 1  Immunization due -     Flu vaccine HIGH DOSE PF(Fluzone Trivalent)  Anxiety -     Escitalopram  Oxalate; Take 1 tablet (20 mg total) by mouth at bedtime.  Dispense: 90 tablet; Refill: 1 -     ALPRAZolam ; Take 1 tablet (0.5 mg total) by mouth 2 (two) times daily as needed for anxiety.  Dispense: 20 tablet; Refill: 0  Mixed hyperlipidemia -     Ezetimibe ; Take 1 tablet (10 mg total) by mouth at bedtime.  Dispense: 90 tablet; Refill: 1  Seasonal allergies -     Montelukast  Sodium; Take 1 tablet (10 mg total) by mouth at bedtime.  Dispense: 90 tablet; Refill: 3  Insomnia, unspecified type -     Amitriptyline HCl; Take 1 tablet (25 mg total) by mouth at bedtime as needed for sleep. Take one hour prior to bedtime  Dispense: 90 tablet; Refill: 1  Arthralgia, unspecified joint -     Pregabalin; Take 1 capsule (25 mg total) by mouth 3 (three) times daily.  Dispense: 90 capsule; Refill: 0  Elevated glucose -     metFORMIN  HCl ER; Take 1 tablet (500 mg total) by mouth daily with breakfast.  Dispense: 90 tablet; Refill: 1  Other chronic pain -     Pregabalin; Take 1 capsule (25 mg total) by mouth 3 (three) times daily.  Dispense: 90 capsule; Refill: 0  Mild intermittent asthma, unspecified whether complicated -     Fluticasone -Salmeterol; Inhale 1 puff into the lungs 2 (two) times daily.  Dispense: 1 each; Refill: 5   Assessment and Plan    Encounter for immunization Received flu shot during the visit. - Administered flu shot.  Essential hypertension Blood pressure elevated at 162 mmHg. Current losartan  may be insufficient. Discussed adding a second antihypertensive. She prefers starting with a lower dose diuretic. - Prescribed losartan  with a low dose diuretic (12.5 mg).  Chronic pain likely due to  arthritis Chronic pain exacerbated by cold weather, affecting daily activities. Current pain management inadequate. Discussed Lyrica for pain management. She has previously used gabapentin  without relief. - Prescribed Lyrica for pain management.  Insomnia Difficulty sleeping, possibly due to stress and pain. Current tizanidine  provides some relief. Discussed amitriptyline as a sleep aid and for pain management. She is open to trying amitriptyline. - Prescribed amitriptyline for insomnia and pain management.  Anxiety disorder Anxiety managed with Lexapro . No significant mood change. Discussed short-term increase in Xanax  for situational anxiety. She understands risks and is willing to consider short-term increase. - Increased Xanax  to 0.5 mg short-term for situational anxiety.     Test results were reviewed and analyzed as part of the medical decision making of this visit.  Reviewed neurology notes during the office visit.  Will start the amitriptyline 25 mg to take 1 hour prior to bedtime.  She knows not to take that tizanidine  and the amitriptyline at the same time.  Tried Lyrica 25 mg 3 times daily for chronic pain.  We discussed briefly changing the Lexapro  but this may not be a good time to do that.  Return sometime in January or when she returns from [place] .  Patient can send us  a message via MyChart if she would like to increase the amitriptyline and/or Lyrica. We changed her blood pressure medicine to a combo losartan /hydrochlorothiazide  100/12.5 mg take 1/day.  If that does not improve her blood pressure we can add a second agent i.e. Norvasc.  We did not do blood work today since it is too early.  Will do it next time. Return in about 2 months (around 04/20/2024), or if symptoms worsen or fail to improve.   Subjective:    Patient ID: Christy Jenkins, female    DOB: 11/08/1950  Age: 73 y.o. MRN: [id number]  Chief Complaint  Patient presents with   Medical Management of Chronic Issues     HPI Discussed the use of AI scribe software for clinical note transcription with the patient, who gave verbal consent to proceed.  History of Present Illness        History of Present Illness Christy Jenkins is a 73 year old female with hypertension and chronic pain who presents with elevated blood pressure and pain management concerns. Patient will be going to [place]  to help care for her sister with rectal cancer  She has been experiencing elevated blood pressure readings, with a recent measurement of SBP 162 mmHg. She is currently taking losartan  100mg . She has a history of taking losartan  with a diuretic in the past. She is concerned about her blood pressure being elevated, and notes that her sister's health issues have been a source of stress.  She describes significant pain, particularly exacerbated by cold weather, which she attributes to arthritis. The pain is severe enough to cause her to pace and cry, and she takes high doses of ibuprofen and Tylenol  in an attempt to manage it. She has previously used gabapentin  without relief and is considering other options for pain management.  She is experiencing sleep disturbances, which she attributes to stress and pain. She has tried tizanidine  for sleep but finds it does not keep her asleep. She mentions a recommendation for amitriptyline to aid sleep but has not yet started it.  She is currently taking Lexapro  for mood but is unsure of its effectiveness, noting she has been on it for a long time. She also uses Xanax  occasionally, particularly in stressful situations, but is cautious about increasing the dose due to potential side effects.  Her family history includes precancerous polyps in her mother and brother, and she has had them as well. Her sister is currently undergoing treatment for rectal cancer, which has been a source of stress for her.  Going to [place]  to help her sister.  Socially, she works part-time and has the  flexibility to work from anywhere, which is important as she plans to spend time in [place]  to support her sister. She notes that her current situation, including her sister's illness and the need to help with a house move, is contributing to her stress levels. Patient Asked if she could do lab work NMR lipo profile that was ordered by Dr. Mona her cardiologist but we were unable to do that today. Assessment and Plan Encounter for immunization Received flu shot during the visit. - Administered flu shot.  Essential hypertension with elevated readings Blood pressure elevated at 162 mmHg. Current losartan  may be insufficient. Discussed adding a second antihypertensive. She prefers starting with a lower dose diuretic. - Prescribed losartan  with a low dose diuretic (12.5 mg).  Chronic pain likely due to arthritis Chronic pain exacerbated by cold weather, affecting daily activities. Current pain management inadequate. Discussed Lyrica for pain management. She has previously used gabapentin  without relief. Patient has not tried Lyrica and is willing to try this.  -  Prescribed Lyrica for pain management starting 25 mg 3 times daily.  Patient is aware that she may need to increase the dosage.  Insomnia Difficulty sleeping, possibly due to stress and pain. Current tizanidine  provides some relief. Discussed amitriptyline as a sleep aid and for pain management. She is open to trying amitriptyline. - Prescribed amitriptyline for insomnia and pain management.  Anxiety disorder Anxiety managed with Lexapro . No significant mood change. Discussed short-term increase in Xanax  for situational anxiety. She understands risks and is willing to consider short-term increase. - Increased Xanax  to 0.5 mg short-term for situational anxiety.    The ASCVD Risk score (Arnett DK, et al., 2019) failed to calculate for the following reasons:   Risk score cannot be calculated because patient has a medical history  suggesting prior/existing ASCVD  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    Dysrhythmia    brady   GERD (gastroesophageal reflux disease)    Glaucoma    Headache    mirgaines   Heart murmur    Hyperlipidemia    Do not remember date but at least 40 years   Hypertension    Pneumonia    Stroke [facility])    pt does not know when she had stroke; scan picked up that pt had stroke   Tremors of nervous system    Past Surgical History:  Procedure Laterality Date   ABDOMINAL HYSTERECTOMY     APPENDECTOMY     BLADDER SUSPENSION  2001   CATHETER REMOVAL     CHOLECYSTECTOMY     COLONOSCOPY     ESOPHAGEAL MANOMETRY N/A 09/06/2021   Procedure: ESOPHAGEAL MANOMETRY (EM);  Surgeon: San Sandor GAILS, DO;  Location: [facility];  Service: Gastroenterology;  Laterality: N/A;   EYE SURGERY     FRACTURE SURGERY     HERNIA REPAIR     LUMBAR DISC SURGERY     right foot surgery     rods and pins   SPINE SURGERY     TONSILLECTOMY     TUBAL LIGATION     UPPER GASTROINTESTINAL ENDOSCOPY     WISDOM TOOTH EXTRACTION  1978   WRIST SURGERY Bilateral    XI ROBOTIC ASSISTED PARAESOPHAGEAL HERNIA REPAIR N/A 11/13/2021   Procedure: XI ROBOTIC ASSISTED PARAESOPHAGEAL HIATAL HERNIA REPAIR WITH FUNDOPLICATION, BILATERAL TAP BLOCK;  Surgeon: Sheldon Standing, MD;  Location: [facility];  Service: General;  Laterality: N/A;   Social History   Tobacco Use   Smoking status: Former    Types: Cigarettes   Smokeless tobacco: Never  Vaping Use   Vaping status: Never Used  Substance Use Topics   Alcohol use: Yes    Comment: occasionally, socially   Drug use: Never   Family History  Problem Relation Age of Onset   Colon polyps Mother    Heart attack Mother    Heart disease Mother    Hypertension Mother    Arthritis Mother     Asthma Mother    Cancer Mother    COPD Mother    Hearing loss Mother    Hyperlipidemia Mother    Vision loss Mother    Diabetes Father    Heart disease Father    Arrhythmia Father    Heart attack Father  Heart failure Father    Hypertension Father    Hyperlipidemia Father    Kidney disease Father    Obesity Father    Rectal cancer Sister    Asthma Sister    Miscarriages / Stillbirths Sister    Obesity Sister    Diabetes Brother    Colon polyps Brother    Asthma Brother    Diabetes Brother    Heart disease Brother    Hyperlipidemia Brother    Hypertension Brother    Obesity Brother    COPD Maternal Uncle    Cancer Maternal Grandmother    Cancer Maternal Grandfather    Hearing loss Maternal Grandfather    Hypertension Paternal Grandmother    Stroke Paternal Grandmother    Vision loss Paternal Grandmother    Learning disabilities Daughter    Stomach cancer Neg Hx    Esophageal cancer Neg Hx    Colon cancer Neg Hx    Allergies  Allergen Reactions   Dust Mite Extract Other (See Comments) and Cough   Molds & Smuts Shortness Of Breath, Itching, Other (See Comments) and Cough   Pollen Extract Shortness Of Breath, Itching, Rash, Other (See Comments) and Cough    Other Reaction(s): Bronchospasm   Statins Other (See Comments)    Severe muscle aches    Tree Extract Shortness Of Breath, Rash, Other (See Comments) and Cough   Tape Itching and Rash    Paper tape okay  Other Reaction(s): Dermatitis    Paper tape okay    ROS    Objective:    BP (!) 142/96   Pulse 60   Temp 98 F (36.7 C)   Ht 5' 3 (1.6 m)   Wt 176 lb (79.8 kg)   SpO2 99%   BMI 31.18 kg/m  BP Readings from Last 3 Encounters:  02/09/24 (!) 142/96  02/01/24 (!) 162/81  11/23/23 120/68   Wt Readings from Last 3 Encounters:  02/09/24 176 lb (79.8 kg)  02/01/24 178 lb (80.7 kg)  01/18/24 178 lb (80.7 kg)    Physical Exam Vitals and nursing note reviewed.  Constitutional:       Appearance: Normal appearance.  HENT:     Head: Normocephalic.     Right Ear: Tympanic membrane, ear canal and external ear normal.     Left Ear: Tympanic membrane, ear canal and external ear normal.  Eyes:     Extraocular Movements: Extraocular movements intact.     Conjunctiva/sclera: Conjunctivae normal.     Pupils: Pupils are equal, round, and reactive to light.  Cardiovascular:     Rate and Rhythm: Normal rate and regular rhythm.     Heart sounds: Normal heart sounds.  Pulmonary:     Effort: Pulmonary effort is normal.     Breath sounds: Normal breath sounds.  Musculoskeletal:     Right lower leg: No edema.     Left lower leg: No edema.  Neurological:     General: No focal deficit present.     Mental Status: She is alert and oriented to person, place, and time.  Psychiatric:        Mood and Affect: Mood normal.        Behavior: Behavior normal.        Thought Content: Thought content normal.        Judgment: Judgment normal.      No results found for any visits on 02/09/24.

## 2024-02-10 ENCOUNTER — Ambulatory Visit: Admitting: Gastroenterology

## 2024-02-18 ENCOUNTER — Other Ambulatory Visit: Payer: Self-pay | Admitting: Gastroenterology

## 2024-02-19 ENCOUNTER — Other Ambulatory Visit: Payer: Self-pay

## 2024-02-19 DIAGNOSIS — E785 Hyperlipidemia, unspecified: Secondary | ICD-10-CM

## 2024-02-20 LAB — NMR, LIPOPROFILE
Cholesterol, Total: 171 mg/dL (ref 100–199)
HDL Particle Number: 49.5 umol/L (ref >=30.5)
HDL-C: 89 mg/dL (ref >39)
LDL Particle Number: 549 nmol/L (ref <1000)
LDL Size: 21 nm (ref >20.5)
LDL-C (NIH Calc): 59 mg/dL (ref 0–99)
LP-IR Score: 33 (ref <=45)
Small LDL Particle Number: <90 nmol/L (ref <=527)
Triglycerides: 135 mg/dL (ref 0–149)

## 2024-02-22 ENCOUNTER — Ambulatory Visit: Admitting: Family Medicine

## 2024-02-29 ENCOUNTER — Ambulatory Visit: Payer: Self-pay | Admitting: Internal Medicine

## 2024-03-08 ENCOUNTER — Telehealth: Payer: Self-pay

## 2024-03-08 NOTE — Telephone Encounter (Signed)
 PT returning call. Please advise.

## 2024-03-08 NOTE — Telephone Encounter (Signed)
 Left message for patient to return call to further discuss need for follow up appointment in February 2026.  Will continue efforts.

## 2024-03-08 NOTE — Telephone Encounter (Signed)
 Patient aware that she is scheduled for 1 year follow up and follow up after EGD/Colon on 05-23-24 at 8:20am.  Patient agreed to plan and verbalized understanding.  No further questions.

## 2024-03-14 ENCOUNTER — Other Ambulatory Visit: Payer: Self-pay | Admitting: Family Medicine

## 2024-03-14 ENCOUNTER — Encounter: Payer: Self-pay | Admitting: Family Medicine

## 2024-03-14 DIAGNOSIS — M2550 Pain in unspecified joint: Secondary | ICD-10-CM

## 2024-03-14 DIAGNOSIS — G8929 Other chronic pain: Secondary | ICD-10-CM

## 2024-03-15 ENCOUNTER — Other Ambulatory Visit: Payer: Self-pay

## 2024-03-15 DIAGNOSIS — M2550 Pain in unspecified joint: Secondary | ICD-10-CM

## 2024-03-15 DIAGNOSIS — G8929 Other chronic pain: Secondary | ICD-10-CM

## 2024-03-15 MED ORDER — PREGABALIN 25 MG PO CAPS: 25.0000 mg | ORAL_CAPSULE | Freq: Three times a day (TID) | ORAL | 0 refills | Status: AC

## 2024-03-22 NOTE — Progress Notes (Signed)
 " Cardiology Office Note   Date:  03/23/2024  ID:  Christy Jenkins, DOB [birth year], MRN [id number] PCP: Aletha Bene, MD  [facility] Providers Cardiologist:  Dub Huntsman, DO     PMH Dyslipidemia Coronary artery disease Coronary CTA 01/28/2021 CAC score 939 (97th percentile) Extensive calcified plaque left main, proximal/mid LAD and RCA No significant stenosis by CT FFR Statin intolerance Elevated LP(a) Family history of premature CAD  Referred to Advanced Lipid Disorders clinic and seen by Dr. Mona on 05/28/2021.  Is followed by Dr. Aloysius for general cardiology.  She had symptoms concerning for angina and underwent coronary CTA which revealed extensive coronary calcification with calcium score of 939 (97th percentile) with no significant stenosis by FFR but aggressive cardiovascular risk reduction recommended.  She had tried almost every statin and could not tolerate due to severe muscle aches which occurred within days of starting the medication and resolved after stopping it.  She was placed on ezetimibe , however lipids remain significantly elevated with LDL 131.  She was started on Repatha  140 mg every 14 days.  She reported fatigue about 1 to 2 days after injection but had significant improvement in lipids with LDL down to 50, HDL 95, triglycerides 91.  She continued on ezetimibe  as well.  At follow-up visit 03/04/2022 she reported fatigue had improved significantly.  LDL particle number was down to 485, small LDL particle number less than 90 nmol/L, and LDL 49.  Last lipid clinic visit was 03/06/2023 at which time lipids continue to be very well-controlled with LDL particle #432, LDL 58, HDL 92, triglycerides 91, and small LDL particle less than 90.  History of Present Illness Discussed the use of AI scribe software for clinical note transcription with the patient, who gave verbal consent to proceed.  History of Present Illness Christy Jenkins is a very pleasant  73 year old female who presents for follow-up of hyperlipidemia. She feels well with no cardiovascular complaints. Her hyperlipidemia is treated with Repatha  and ezetimibe , which she tolerates without side effects. She cannot tolerate statins.  Antihypertensive therapy was recently adjusted by her general practitioner with addition of hydrochlorothiazide to losartan . She monitors blood pressure at home and reports stable readings. She has lost about 25 pounds over the last couple of years. She stays active with regular walking and yard work and swims in warm weather. She has no chest pain, shortness of breath, palpitations, leg swelling, lightheadedness, or presyncope, except for occasional shortness of breath with pollen or heat. She mainly eats home-cooked meals and has avoided added salt for decades. She rarely eats out to limit dietary sodium.   ROS: See HPI  Studies Reviewed EKG Interpretation Date/Time:  Wednesday March 23 2024 08:22:19 EST Ventricular Rate:  86 PR Interval:  168 QRS Duration:  86 QT Interval:  368 QTC Calculation: 440 R Axis:   7  Text Interpretation: Normal sinus rhythm Low voltage QRS When compared with ECG of 05-Nov-2020 12:17, No significant change was found Confirmed by Percy Browning [phone]) on 03/23/2024 8:44:30 AM     Lipoprotein (a)  Date/Time Value Ref Range Status  02/17/2022 12:56 PM 86.1 (H) <75.0 nmol/L Final    Comment:    Note:  Values greater than or equal to 75.0 nmol/L may        indicate an independent risk factor for CHD,        but must be evaluated with caution when applied        to  non-Caucasian populations due to the        influence of genetic factors on Lp(a) across        ethnicities.     Risk Assessment/Calculations           Physical Exam VS:  BP 128/64   Pulse 86   Ht 5' 3 (1.6 m)   Wt 174 lb (78.9 kg)   SpO2 94%   BMI 30.82 kg/m    Wt Readings from Last 3 Encounters:  03/23/24 174 lb (78.9 kg)  02/09/24 176  lb (79.8 kg)  02/01/24 178 lb (80.7 kg)    GEN: Well nourished, well developed in no acute distress NECK: No JVD; No carotid bruits CARDIAC: RRR, no murmurs, rubs, gallops RESPIRATORY:  Clear to auscultation without rales, wheezing or rhonchi  ABDOMEN: Soft, non-tender, non-distended EXTREMITIES:  No edema; No deformity    Assessment & Plan Dyslipidemia LDL goal < 55 Lipid panel completed 02/19/2024 with total cholesterol 171, HDL 89, LDL 59, and triglycerides 864.  Lipids are well-controlled. She experiences no side effects from Repatha  or ezetimibe .  We discussed healthy diet and exercise recommendations. She generally eats a heart healthy diet and remains active with regular yard and house work, walking, and swimming during the summer months.  - Continue Repatha  140 mg every 14 days - Continue ezetimibe  10 mg daily - Heart healthy diet avoiding processed foods, saturated fat, sugar, and other simple carbohydrates encouraged - Be as physically active as possible every day and aim for at least 150 minutes of moderate intensity exercise each week  CAD Cardiac risk Coronary CTA 01/28/21 revealed extensive calcification with CAC score of 939 (97th percentile) with no significant stenosis by CT FFR.  EKG today NSR with low voltage QRS, no ST/T abnormality. She denies chest pain, dyspnea, or other symptoms concerning for angina.  No indication for further ischemic evaluation at this time.  Lipids are well-controlled.  BP is well-controlled.   - Continue aspirin , ezetimibe , Hyzaar, Repatha   Hypertension   BP in clinic is stable. She reports recent change in antihypertensive therapy by PCP with home BP that is well controlled. Renal function stable on labs completed 11/23/2023. - Continue losartan -hydrochlorothiazide - Measurement per PCP         Dispo: 1 year with Dr. Mona or me  Signed, Rosaline Bane, NP-C "

## 2024-03-23 ENCOUNTER — Ambulatory Visit (HOSPITAL_BASED_OUTPATIENT_CLINIC_OR_DEPARTMENT_OTHER): Admitting: Nurse Practitioner

## 2024-03-23 ENCOUNTER — Encounter (HOSPITAL_BASED_OUTPATIENT_CLINIC_OR_DEPARTMENT_OTHER): Payer: Self-pay | Admitting: Nurse Practitioner

## 2024-03-23 VITALS — BP 128/64 | HR 86 | Ht 63.0 in | Wt 174.0 lb

## 2024-03-23 DIAGNOSIS — I2510 Atherosclerotic heart disease of native coronary artery without angina pectoris: Secondary | ICD-10-CM | POA: Diagnosis not present

## 2024-03-23 DIAGNOSIS — E785 Hyperlipidemia, unspecified: Secondary | ICD-10-CM | POA: Diagnosis not present

## 2024-03-23 DIAGNOSIS — I10 Essential (primary) hypertension: Secondary | ICD-10-CM | POA: Diagnosis not present

## 2024-03-23 DIAGNOSIS — R931 Abnormal findings on diagnostic imaging of heart and coronary circulation: Secondary | ICD-10-CM | POA: Diagnosis not present

## 2024-03-23 DIAGNOSIS — Z7189 Other specified counseling: Secondary | ICD-10-CM

## 2024-03-23 NOTE — Patient Instructions (Signed)
 Medication Instructions:   Your physician recommends that you continue on your current medications as directed. Please refer to the Current Medication list given to you today.   *If you need a refill on your cardiac medications before your next appointment, please call your pharmacy*  Lab Work:  None ordered.  If you have labs (blood work) drawn today and your tests are completely normal, you will receive your results only by: MyChart Message (if you have MyChart) OR A paper copy in the mail If you have any lab test that is abnormal or we need to change your treatment, we will call you to review the results.  Testing/Procedures:  None ordered.  Follow-Up: At [facility], you and your health needs are our priority.  As part of our continuing mission to provide you with exceptional heart care, our providers are all part of one team.  This team includes your primary Cardiologist (physician) and Advanced Practice Providers or APPs (Physician Assistants and Nurse Practitioners) who all work together to provide you with the care you need, when you need it.  Your next appointment:   1 year(s)  Provider:   K. Chad Hilty, MD or Rosaline Bane, NP    We recommend signing up for the patient portal called MyChart.  Sign up information is provided on this After Visit Summary.  MyChart is used to connect with patients for Virtual Visits (Telemedicine).  Patients are able to view lab/test results, encounter notes, upcoming appointments, etc.  Non-urgent messages can be sent to your provider as well.   To learn more about what you can do with MyChart, go to [email or web address].   Other Instructions  Your physician wants you to follow-up in: 1 year. Adopting a Healthy Lifestyle.   Weight: Know what a healthy weight is for you (roughly BMI <25) and aim to maintain this. You can calculate your body mass index on your smart phone. Unfortunately, this is not the most accurate measure  of healthy weight, but it is the simplest measurement to use. A more accurate measurement involves body scanning which measures lean muscle, fat tissue and bony density. We do not have this equipment at [facility].    Diet: Aim for 7+ servings of fruits and vegetables daily Limit animal fats in diet for cholesterol and heart health - choose grass fed whenever available Avoid highly processed foods (fast food burgers, tacos, fried chicken, pizza, hot dogs, french fries)  Saturated fat comes in the form of butter, lard, coconut oil, margarine, partially hydrogenated oils, dairy products, and fat in meat. These increase your risk of cardiovascular disease.  Use healthy plant oils, such as olive, canola, soy, corn, sunflower and peanut.  Whole foods such as fruits, vegetables and whole grains have fiber  Men need > 38 grams of fiber per day Women need > 25 grams of fiber per day  Load up on vegetables and fruits - one-half of your plate: Aim for color and variety, and remember that potatoes dont count. Go for whole grains - one-quarter of your plate: Whole wheat, barley, wheat berries, quinoa, oats, brown rice, and foods made with them. If you want pasta, go with whole wheat pasta. Protein power - one-quarter of your plate: Fish, chicken, beans, and nuts are all healthy, versatile protein sources. Limit red meat. You need carbohydrates for energy! The type of carbohydrate is more important than the amount. Choose carbohydrates such as vegetables, fruits, whole grains, beans, and nuts in the place of white rice,  white pasta, potatoes (baked or fried), macaroni and cheese, cakes, cookies, and donuts.  If youre thirsty, drink water. Coffee and tea are good in moderation, but skip sugary drinks and limit milk and dairy products to one or two daily servings. Keep sugar intake at 6 teaspoons or 24 grams or LESS       Exercise: Aim for 150 min of moderate intensity exercise weekly for heart health, and  weights twice weekly for bone health Stay active - any steps are better than no steps! Aim for 7-9 hours of sleep daily   Sleep: This provides your body with the reset and relaxation that it needs!  Aim to get 7-8 hours of sleep each night. Limit caffeine, screen time, and other distractions prior to bedtime.  Keep your bedroom cool and dark and do not wear heavy clothing to bed or use heavy bed covers - layer if needed.      You will receive a reminder letter in the mail two months in advance. If you don't receive a letter, please call our office to schedule the follow-up appointment.

## 2024-04-04 ENCOUNTER — Encounter (HOSPITAL_BASED_OUTPATIENT_CLINIC_OR_DEPARTMENT_OTHER): Payer: Self-pay

## 2024-04-05 ENCOUNTER — Telehealth: Payer: Self-pay | Admitting: Pharmacy Technician

## 2024-04-05 NOTE — Telephone Encounter (Signed)
 Patient Advocate Encounter   The patient was approved for a [employer] grant that will help cover the cost of REPATHA  Total amount awarded, 2500.  Effective: 04/29/24 - 04/28/25   APW:[id number] ERW:[id number] Hmnle:[id number] PI:[id number] [employer] ID: [id number]   Pharmacy provided with approval and processing information. Patient informed via mychart

## 2024-04-11 ENCOUNTER — Ambulatory Visit: Admitting: Family Medicine

## 2024-05-23 ENCOUNTER — Ambulatory Visit: Admitting: Gastroenterology

## 2024-05-23 ENCOUNTER — Ambulatory Visit: Admitting: Family Medicine

## 2024-08-04 ENCOUNTER — Ambulatory Visit: Admitting: Cardiology

## 2024-10-20 ENCOUNTER — Encounter
